# Patient Record
Sex: Female | Born: 1990 | Race: White | Hispanic: No | Marital: Married | Smoking: Current every day smoker
Health system: Southern US, Community
[De-identification: ages and names within clinical notes are randomized; demographics above are authoritative.]

## PROBLEM LIST (undated history)

## (undated) DIAGNOSIS — M1990 Unspecified osteoarthritis, unspecified site: Secondary | ICD-10-CM

## (undated) DIAGNOSIS — M5136 Other intervertebral disc degeneration, lumbar region: Secondary | ICD-10-CM

## (undated) DIAGNOSIS — F1910 Other psychoactive substance abuse, uncomplicated: Secondary | ICD-10-CM

## (undated) DIAGNOSIS — I739 Peripheral vascular disease, unspecified: Secondary | ICD-10-CM

## (undated) DIAGNOSIS — G4730 Sleep apnea, unspecified: Secondary | ICD-10-CM

## (undated) DIAGNOSIS — M51369 Other intervertebral disc degeneration, lumbar region without mention of lumbar back pain or lower extremity pain: Secondary | ICD-10-CM

## (undated) DIAGNOSIS — L0390 Cellulitis, unspecified: Secondary | ICD-10-CM

## (undated) HISTORY — PX: CHOLECYSTECTOMY: SHX55

---

## 2012-04-30 DIAGNOSIS — R079 Chest pain, unspecified: Secondary | ICD-10-CM

## 2014-11-17 ENCOUNTER — Telehealth: Payer: Self-pay | Admitting: Family Medicine

## 2014-11-17 NOTE — Telephone Encounter (Signed)
Patient advised that we do not prescribe suboxone her at our office.

## 2015-12-23 ENCOUNTER — Emergency Department (HOSPITAL_COMMUNITY): Admission: EM | Admit: 2015-12-23 | Discharge: 2015-12-23 | Discharge status: Absent without leave | Payer: Self-pay

## 2015-12-23 ENCOUNTER — Emergency Department (HOSPITAL_COMMUNITY): Payer: Self-pay

## 2015-12-23 ENCOUNTER — Encounter (HOSPITAL_COMMUNITY): Payer: Self-pay

## 2015-12-23 ENCOUNTER — Emergency Department (HOSPITAL_COMMUNITY)
Admission: EM | Admit: 2015-12-23 | Discharge: 2015-12-23 | Disposition: A | Discharge status: Home / Self Care | Payer: Self-pay | Attending: Emergency Medicine | Admitting: Emergency Medicine

## 2015-12-23 DIAGNOSIS — F17200 Nicotine dependence, unspecified, uncomplicated: Secondary | ICD-10-CM | POA: Insufficient documentation

## 2015-12-23 DIAGNOSIS — L03116 Cellulitis of left lower limb: Secondary | ICD-10-CM | POA: Insufficient documentation

## 2015-12-23 MED ORDER — CLINDAMYCIN HCL 150 MG PO CAPS
300.0000 mg | ORAL_CAPSULE | Freq: Once | ORAL | Status: AC
Start: 2015-12-23 — End: 2015-12-23
  Administered 2015-12-23: 300 mg via ORAL
  Filled 2015-12-23: qty 2

## 2015-12-23 MED ORDER — HYDROCODONE-ACETAMINOPHEN 5-325 MG PO TABS: ORAL_TABLET | ORAL | Status: DC

## 2015-12-23 MED ORDER — CLINDAMYCIN HCL 300 MG PO CAPS
300.0000 mg | ORAL_CAPSULE | Freq: Four times a day (QID) | ORAL | Status: DC
Start: 2015-12-23 — End: 2015-12-31

## 2015-12-23 NOTE — ED Provider Notes (Signed)
CSN: [id number]     Arrival date & time 12/23/15  1331 History   First MD Initiated Contact with Patient 12/23/15 1645     Chief Complaint  Patient presents with  . Leg Swelling     (Consider location/radiation/quality/duration/timing/severity/associated sxs/prior Treatment) HPI   Barbara Dodson is a 25 y.o. female who presents to the Emergency Department complaining of pain, swelling and rash to her left lower leg that began three days ago.  She states the following day, a similar appearing rash began on the right lower leg as well.  She states that she has redness and swelling to her left lower leg up to the level of her knee that resolved after elevating her leg.  Pain with weight bearing.  She denies numbness, weakness of the extremities, fever chills, or new exposures.  No hx of previous DVT's.      History reviewed. No pertinent past medical history. Past Surgical History  Procedure Laterality Date  . Cesarean section    . Cholecystectomy     No family history on file. Social History  Substance Use Topics  . Smoking status: Current Every Day Smoker  . Smokeless tobacco: None  . Alcohol Use: No   OB History    No data available     Review of Systems  Constitutional: Negative for fever and chills.  Gastrointestinal: Negative for nausea, vomiting and abdominal pain.  Musculoskeletal: Positive for myalgias. Negative for joint swelling and neck pain.  Skin: Positive for color change and rash. Negative for wound.       Bilateral lower legs  Neurological: Negative for weakness and numbness.  All other systems reviewed and are negative.     Allergies  Amoxicillin; Other; Penicillins; Prednisone; Seroquel; and Sulfa antibiotics  Home Medications   Prior to Admission medications   Not on File   BP 128/81 mmHg  Pulse 90  Temp(Src) 97.9 F (36.6 C) (Temporal)  Resp 16  Ht  (1.702 m)  Wt 125.193 kg  BMI 43.22 kg/m2  SpO2 100%  LMP 12/17/2015 Physical  Exam  Constitutional: She appears well-developed and well-nourished. No distress.  HENT:  Head: Normocephalic.  Mouth/Throat: Oropharynx is clear and moist.  Neck: Normal range of motion.  Cardiovascular: Normal rate, regular rhythm and intact distal pulses.   No murmur heard. Pulmonary/Chest: Effort normal and breath sounds normal. No respiratory distress. She exhibits no tenderness.  Musculoskeletal: Normal range of motion. She exhibits tenderness. She exhibits no edema.  Tenderness of the left posterior lower leg.  No edema  Lymphadenopathy:    She has no cervical adenopathy.  Neurological: She is alert. She exhibits normal muscle tone. Coordination normal.  Skin: Skin is warm. Rash noted.  Non-blanching erythematous plaque to the left lower leg just superior to the ankle.  Smaller, similar appear plaque to the right ankle as well.  No edema. Compartments soft.  Nursing note and vitals reviewed.   ED Course  Procedures (including critical care time) Labs Review Labs Reviewed - No data to display  Imaging Review US Venous Img Lower Unilateral Left  12/23/2015  CLINICAL DATA:  Acute onset of left lower extremity pain, erythema and swelling. Initial encounter. EXAM: LEFT LOWER EXTREMITY VENOUS DOPPLER ULTRASOUND TECHNIQUE: Gray-scale sonography with graded compression, as well as color Doppler and duplex ultrasound were performed to evaluate the lower extremity deep venous systems from the level of the common femoral vein and including the common femoral, femoral, profunda femoral, popliteal and calf veins including  the posterior tibial, peroneal and gastrocnemius veins when visible. The superficial great saphenous vein was also interrogated. Spectral Doppler was utilized to evaluate flow at rest and with distal augmentation maneuvers in the common femoral, femoral and popliteal veins. COMPARISON:  None. FINDINGS: Contralateral Common Femoral Vein: Respiratory phasicity is normal and  symmetric with the symptomatic side. No evidence of thrombus. Normal compressibility. Common Femoral Vein: No evidence of thrombus. Normal compressibility, respiratory phasicity and response to augmentation. Saphenofemoral Junction: No evidence of thrombus. Normal compressibility and flow on color Doppler imaging. Profunda Femoral Vein: No evidence of thrombus. Normal compressibility and flow on color Doppler imaging. Femoral Vein: No evidence of thrombus. Normal compressibility, respiratory phasicity and response to augmentation. Popliteal Vein: No evidence of thrombus. Normal compressibility, respiratory phasicity and response to augmentation. Calf Veins: No evidence of thrombus. Normal compressibility and flow on color Doppler imaging. Superficial Great Saphenous Vein: No evidence of thrombus. Normal compressibility and flow on color Doppler imaging. Venous Reflux:  None. Other Findings:  None. IMPRESSION: No evidence of deep venous thrombosis. Electronically Signed   By: Roanna RaiderJeffery  Chang M.D.   On: 12/23/2015 18:16    I have personally reviewed and evaluated these images and lab results as part of my medical decision-making.   EKG Interpretation None      MDM   Final diagnoses:  Cellulitis of left lower extremity   Rash likely c/w cellulitis.  US neg for DVT.  NV intact.  Pt agrees to clindamycin and close PMD f/u and recheck in 2 days if not improving.  Pt agrees to plan and appears stable for d/c     Pauline Ausammy Evona Westra, PA-C 12/25/15 1419  Bethann BerkshireJoseph Zammit, MD 12/26/15 [phone]

## 2015-12-23 NOTE — Discharge Instructions (Signed)
Cellulitis °Cellulitis is an infection of the skin and the tissue under the skin. The infected area is usually red and tender. This happens most often in the arms and lower legs. °HOME CARE  °· Take your antibiotic medicine as told. Finish the medicine even if you start to feel better. °· Keep the infected arm or leg raised (elevated). °· Put a warm cloth on the area up to 4 times per day. °· Only take medicines as told by your doctor. °· Keep all doctor visits as told. °GET HELP IF: °· You see red streaks on the skin coming from the infected area. °· Your red area gets bigger or turns a dark color. °· Your bone or joint under the infected area is painful after the skin heals. °· Your infection comes back in the same area or different area. °· You have a puffy (swollen) bump in the infected area. °· You have new symptoms. °· You have a fever. °GET HELP RIGHT AWAY IF:  °· You feel very sleepy. °· You throw up (vomit) or have watery poop (diarrhea). °· You feel sick and have muscle aches and pains. °  °This information is not intended to replace advice given to you by your health care provider. Make sure you discuss any questions you have with your health care provider. °  °Document Released: 02/15/2008 Document Revised: 05/20/2015 Document Reviewed: 11/14/2011 °Elsevier Interactive Patient Education ©2016 [employer] ° °

## 2015-12-23 NOTE — ED Notes (Signed)
Pt reports swelling to both lower legs since Friday and red rash started Sunday night.  Denies any itching but is painful.

## 2015-12-23 NOTE — ED Notes (Signed)
Pt with left leg and foot throbbing pain, redness noted to left ankle and right ankle, L>R

## 2015-12-23 NOTE — ED Notes (Signed)
Pedal pulses palpable. Cap refill< 3 sec.

## 2015-12-31 ENCOUNTER — Encounter (HOSPITAL_COMMUNITY): Payer: Self-pay

## 2015-12-31 ENCOUNTER — Emergency Department (HOSPITAL_COMMUNITY)
Admission: EM | Admit: 2015-12-31 | Discharge: 2015-12-31 | Disposition: A | Discharge status: Home / Self Care | Payer: Self-pay | Attending: Emergency Medicine | Admitting: Emergency Medicine

## 2015-12-31 DIAGNOSIS — L03119 Cellulitis of unspecified part of limb: Secondary | ICD-10-CM

## 2015-12-31 DIAGNOSIS — Z791 Long term (current) use of non-steroidal anti-inflammatories (NSAID): Secondary | ICD-10-CM | POA: Insufficient documentation

## 2015-12-31 DIAGNOSIS — L03116 Cellulitis of left lower limb: Secondary | ICD-10-CM | POA: Insufficient documentation

## 2015-12-31 DIAGNOSIS — L03115 Cellulitis of right lower limb: Secondary | ICD-10-CM | POA: Insufficient documentation

## 2015-12-31 DIAGNOSIS — F17200 Nicotine dependence, unspecified, uncomplicated: Secondary | ICD-10-CM | POA: Insufficient documentation

## 2015-12-31 HISTORY — DX: Cellulitis, unspecified: L03.90

## 2015-12-31 MED ORDER — CLINDAMYCIN HCL 150 MG PO CAPS
300.0000 mg | ORAL_CAPSULE | Freq: Once | ORAL | Status: AC
  Administered 2015-12-31: 300 mg via ORAL
  Filled 2015-12-31: qty 2

## 2015-12-31 MED ORDER — HYDROCODONE-ACETAMINOPHEN 5-325 MG PO TABS: 1.0000 | ORAL_TABLET | ORAL | Status: DC | PRN

## 2015-12-31 MED ORDER — DEXAMETHASONE SODIUM PHOSPHATE 10 MG/ML IJ SOLN
10.0000 mg | Freq: Once | INTRAMUSCULAR | Status: AC
  Administered 2015-12-31: 10 mg via INTRAMUSCULAR
  Filled 2015-12-31: qty 1

## 2015-12-31 MED ORDER — CLINDAMYCIN HCL 300 MG PO CAPS: 300.0000 mg | ORAL_CAPSULE | Freq: Three times a day (TID) | ORAL | Status: DC

## 2015-12-31 MED ORDER — HYDROCODONE-ACETAMINOPHEN 5-325 MG PO TABS
1.0000 | ORAL_TABLET | Freq: Once | ORAL | Status: AC
  Administered 2015-12-31: 1 via ORAL
  Filled 2015-12-31: qty 1

## 2015-12-31 NOTE — ED Provider Notes (Signed)
CSN: [id number]     Arrival date & time 12/31/15  2059 History  By signing my name below, I, Budd PalmerVanessa Prueter, attest that this documentation has been prepared under the direction and in the presence of Rolland PorterMark Yoskar Murrillo, MD. Electronically Signed: Budd PalmerVanessa Prueter, ED Scribe. 12/31/2015. 9:53 PM.    Chief Complaint  Patient presents with  . Leg Swelling   The history is provided by the patient. No language interpreter was used.   HPI Comments: Barbara Dodson is a 25 y.o. female smoker with a PMHx of cellulites who presents to the Emergency Department complaining of bilateral, recurrent, painful foot and leg swelling onset today. Pt states she was seen for the same in the ED 1 week ago, when she was diagnosed with cellulitis and prescribed clindamycin. She notes she took her last dose of antibiotics yesterday, and notes that the swelling and redness had improved, but not completely resolved. She states the pain is worse now in her right leg, and both legs feel as though they are throbbing. She reports associated redness to the lower legs and lower back pain. She notes that for the past 6 months she has been standing and walking a lot at work. She notes that she has had leg swelling form this in the past, but never the painful redness. She denies any exposure to new products or environments. She states she does not have a PCP as she does not currently have insurance. Pt denies itching, fever, and chills.  Pt is allergic to penicillins, amoxicillin, sulfa-antibiotics, prednisone, and Seroquel.   Past Medical History  Diagnosis Date  . Cellulitis    Past Surgical History  Procedure Laterality Date  . Cesarean section    . Cholecystectomy     No family history on file. Social History  Substance Use Topics  . Smoking status: Current Every Day Smoker  . Smokeless tobacco: None  . Alcohol Use: No   OB History    No data available     Review of Systems  Constitutional: Negative for fever, chills,  diaphoresis, appetite change and fatigue.  HENT: Negative for mouth sores, sore throat and trouble swallowing.   Eyes: Negative for visual disturbance.  Respiratory: Negative for cough, chest tightness, shortness of breath and wheezing.   Cardiovascular: Positive for leg swelling. Negative for chest pain.  Gastrointestinal: Negative for nausea, vomiting, abdominal pain, diarrhea and abdominal distention.  Endocrine: Negative for polydipsia, polyphagia and polyuria.  Genitourinary: Negative for dysuria, frequency and hematuria.  Musculoskeletal: Positive for myalgias and back pain. Negative for gait problem.  Skin: Positive for color change. Negative for pallor and rash.  Neurological: Negative for dizziness, syncope, light-headedness and headaches.  Hematological: Does not bruise/bleed easily.  Psychiatric/Behavioral: Negative for behavioral problems and confusion.    Allergies  Amoxicillin; Etodolac; Penicillins; Prednisone; Seroquel; and Sulfa antibiotics  Home Medications   Prior to Admission medications   Medication Sig Start Date End Date Taking? Authorizing Provider  citalopram (CELEXA) 20 MG tablet Take 20 mg by mouth 2 (two) times daily.   Yes Historical Provider, MD  ibuprofen (ADVIL,MOTRIN) 200 MG tablet Take 200 mg by mouth every 6 (six) hours as needed for fever or moderate pain.   Yes Historical Provider, MD  clindamycin (CLEOCIN) 300 MG capsule Take 1 capsule (300 mg total) by mouth 3 (three) times daily. 12/31/15   Rolland PorterMark Demarian Epps, MD  HYDROcodone-acetaminophen (NORCO/VICODIN) 5-325 MG tablet Take 1 tablet by mouth every 4 (four) hours as needed. 12/31/15   Loraine LericheMark  Fayrene Fearing, MD   BP 124/83 mmHg  Pulse 96  Temp(Src) 98 F (36.7 C) (Oral)  Resp 20  Ht  (1.702 m)  Wt 270 lb (122.471 kg)  BMI 42.28 kg/m2  SpO2 100%  LMP 12/17/2015 Physical Exam  Constitutional: She is oriented to person, place, and time. She appears well-developed and well-nourished. No distress.  HENT:   Head: Normocephalic.  Eyes: Conjunctivae are normal. Pupils are equal, round, and reactive to light. No scleral icterus.  Neck: Normal range of motion. Neck supple. No thyromegaly present.  Cardiovascular: Normal rate and regular rhythm.  Exam reveals no gallop and no friction rub.   No murmur heard. Pulmonary/Chest: Effort normal and breath sounds normal. No respiratory distress. She has no wheezes. She has no rales.  Abdominal: Soft. Bowel sounds are normal. She exhibits no distension. There is no tenderness. There is no rebound.  Musculoskeletal: Normal range of motion. She exhibits edema.  Diffuse circumferential erythema of the ankle ankles and distal 1/3 or the BLE.   Neurological: She is alert and oriented to person, place, and time.  Skin: Skin is warm and dry. No rash noted. There is erythema.  Psychiatric: She has a normal mood and affect. Her behavior is normal.    ED Course  Procedures  DIAGNOSTIC STUDIES: Oxygen Saturation is 100% on RA, normal by my interpretation.    COORDINATION OF CARE: 9:51 PM - Discussed plans to order a 10-day antibiotic and a decadron shot. Will refer to a PCP. Pt advised of plan for treatment and pt agrees.  Labs Review Labs Reviewed - No data to display  Imaging Review No results found. I have personally reviewed and evaluated these images and lab results as part of my medical decision-making.   EKG Interpretation None      MDM   Final diagnoses:  Cellulitis of lower extremity, unspecified laterality    Does not appear to be a vasculitis. No purpura. Normal perfusion. Likely recurrent cellulitis. We'll lengthen her course of antibiotics. No option allergy for altered antibiotic course. Single dose Decadron here. Elevation. Pain control. Primary care follow-up.  Rolland Porter, MD 12/31/15 [phone]

## 2015-12-31 NOTE — ED Notes (Signed)
My feet and legs are swelling. I did have cellulitis and it went away, looks like it is coming back now.  I was diagnosed 2 weeks ago with cellulitis in both of my legs and I have taken all of my antibiotics.

## 2015-12-31 NOTE — Discharge Instructions (Signed)

## 2016-10-31 ENCOUNTER — Emergency Department (HOSPITAL_COMMUNITY): Admission: EM | Admit: 2016-10-31 | Discharge: 2016-11-01 | Discharge status: Absent without leave | Payer: Self-pay

## 2016-10-31 DIAGNOSIS — L03115 Cellulitis of right lower limb: Secondary | ICD-10-CM | POA: Insufficient documentation

## 2016-10-31 DIAGNOSIS — F17200 Nicotine dependence, unspecified, uncomplicated: Secondary | ICD-10-CM | POA: Insufficient documentation

## 2016-10-31 DIAGNOSIS — M79662 Pain in left lower leg: Secondary | ICD-10-CM | POA: Insufficient documentation

## 2016-11-01 ENCOUNTER — Encounter (HOSPITAL_COMMUNITY): Payer: Self-pay | Admitting: Emergency Medicine

## 2016-11-01 ENCOUNTER — Ambulatory Visit (HOSPITAL_COMMUNITY): Admit: 2016-11-01 | Payer: Self-pay

## 2016-11-01 ENCOUNTER — Emergency Department (HOSPITAL_COMMUNITY)
Admission: EM | Admit: 2016-11-01 | Discharge: 2016-11-01 | Disposition: A | Discharge status: Home / Self Care | Payer: Self-pay | Attending: Emergency Medicine | Admitting: Emergency Medicine

## 2016-11-01 DIAGNOSIS — L03115 Cellulitis of right lower limb: Secondary | ICD-10-CM

## 2016-11-01 DIAGNOSIS — M79604 Pain in right leg: Secondary | ICD-10-CM

## 2016-11-01 HISTORY — DX: Peripheral vascular disease, unspecified: I73.9

## 2016-11-01 HISTORY — DX: Sleep apnea, unspecified: G47.30

## 2016-11-01 LAB — CBC WITH DIFFERENTIAL/PLATELET
Basophils Absolute: 0 10*3/uL (ref 0.0–0.1)
Basophils Relative: 0 %
EOS ABS: 0.2 10*3/uL (ref 0.0–0.7)
EOS PCT: 4 %
HCT: 39.7 % (ref 36.0–46.0)
Hemoglobin: 13.2 g/dL (ref 12.0–15.0)
LYMPHS ABS: 1.9 10*3/uL (ref 0.7–4.0)
LYMPHS PCT: 37 %
MCH: 30.8 pg (ref 26.0–34.0)
MCHC: 33.2 g/dL (ref 30.0–36.0)
MCV: 92.8 fL (ref 78.0–100.0)
MONO ABS: 0.2 10*3/uL (ref 0.1–1.0)
MONOS PCT: 5 %
Neutro Abs: 2.7 10*3/uL (ref 1.7–7.7)
Neutrophils Relative %: 54 %
PLATELETS: 138 10*3/uL — AB (ref 150–400)
RBC: 4.28 MIL/uL (ref 3.87–5.11)
RDW: 13.4 % (ref 11.5–15.5)
WBC: 5 10*3/uL (ref 4.0–10.5)

## 2016-11-01 LAB — BASIC METABOLIC PANEL
Anion gap: 7 (ref 5–15)
BUN: 12 mg/dL (ref 6–20)
CO2: 27 mmol/L (ref 22–32)
Calcium: 8.7 mg/dL — ABNORMAL LOW (ref 8.9–10.3)
Chloride: 106 mmol/L (ref 101–111)
Creatinine, Ser: 0.61 mg/dL (ref 0.44–1.00)
GFR calc Af Amer: >60 mL/min (ref >60)
GLUCOSE: 91 mg/dL (ref 65–99)
Potassium: 4.2 mmol/L (ref 3.5–5.1)
SODIUM: 140 mmol/L (ref 135–145)

## 2016-11-01 LAB — I-STAT BETA HCG BLOOD, ED (MC, WL, AP ONLY)

## 2016-11-01 MED ORDER — HYDROCODONE-ACETAMINOPHEN 5-325 MG PO TABS: 1.0000 | ORAL_TABLET | ORAL | 0 refills | Status: DC | PRN

## 2016-11-01 MED ORDER — DOXYCYCLINE HYCLATE 100 MG PO CAPS: 100.0000 mg | ORAL_CAPSULE | Freq: Two times a day (BID) | ORAL | 0 refills | Status: DC

## 2016-11-01 MED ORDER — ENOXAPARIN SODIUM 150 MG/ML ~~LOC~~ SOLN
1.0000 mg/kg | Freq: Once | SUBCUTANEOUS | Status: AC
  Administered 2016-11-01: 125 mg via SUBCUTANEOUS
  Filled 2016-11-01: qty 1

## 2016-11-01 MED ORDER — VANCOMYCIN HCL IN DEXTROSE 1-5 GM/200ML-% IV SOLN
1000.0000 mg | Freq: Once | INTRAVENOUS | Status: AC
  Administered 2016-11-01: 1000 mg via INTRAVENOUS
  Filled 2016-11-01: qty 200

## 2016-11-01 MED ORDER — FENTANYL CITRATE (PF) 100 MCG/2ML IJ SOLN
50.0000 ug | Freq: Once | INTRAMUSCULAR | Status: AC
  Administered 2016-11-01: 50 ug via INTRAVENOUS
  Filled 2016-11-01: qty 2

## 2016-11-01 NOTE — Discharge Instructions (Addendum)
PLEASE RETURN AT 11:30 AM FOR YOUR ULTRASOUND OF YOUR RIGHT LEG  We will use doxycycline instead of bactrim due your sulfa allergy

## 2016-11-01 NOTE — ED Triage Notes (Signed)
Patient complaining of bilateral leg swelling and redness x 3 days. States she has history of cellulitis in bilateral legs.

## 2016-11-01 NOTE — ED Provider Notes (Signed)
[facility]-EMERGENCY DEPT Provider Note   CSN: [id number] Arrival date & time: 10/31/16  2358  By signing my name below, I, Barbara Dodson, attest that this documentation has been prepared under the direction and in the presence of [name], MD. Electronically Signed: Elder Dodson, Scribe. 11/01/16. 12:47 AM.   History   Chief Complaint Chief Complaint  Patient presents with  . Recurrent Skin Infections    HPI Barbara Dodson is a 26 y.o. female with history of PVD and recurrent cellulitis of the bilateral legs who presents to the ED for evaluation of leg pain. This patient states that 3 days ago she developed worsening pain, redness, and swelling in her lower legs consistent with her chronic disease process. She states that her R leg is more severe than her left. She denies any fevers. Denies chest pain or dyspnea. No nausea, vomiting, or abdominal pain. Denies any history of DVT/PE. Denies any recent trauma to the legs.   The history is provided by the patient. No language interpreter was used.    Past Medical History:  Diagnosis Date  . Cellulitis   . Peripheral vascular disease (HCC)   . Sleep apnea     There are no active problems to display for this patient.   Past Surgical History:  Procedure Laterality Date  . CESAREAN SECTION    . CHOLECYSTECTOMY      OB History    No data available       Home Medications    Prior to Admission medications   Medication Sig Start Date End Date Taking? Authorizing Provider  citalopram (CELEXA) 20 MG tablet Take 20 mg by mouth 2 (two) times daily.    Historical Provider, MD  clindamycin (CLEOCIN) 300 MG capsule Take 1 capsule (300 mg total) by mouth 3 (three) times daily. 12/31/15   Rolland Porter, MD  HYDROcodone-acetaminophen (NORCO/VICODIN) 5-325 MG tablet Take 1 tablet by mouth every 4 (four) hours as needed. 12/31/15   Rolland Porter, MD  ibuprofen (ADVIL,MOTRIN) 200 MG tablet Take 200 mg by mouth every 6 (six) hours as  needed for fever or moderate pain.    Historical Provider, MD    Family History History reviewed. No pertinent family history.  Social History Social History  Substance Use Topics  . Smoking status: Current Every Day Smoker  . Smokeless tobacco: Never Used  . Alcohol use Yes     Comment: occasionally     Allergies   Amoxicillin; Etodolac; Penicillins; Prednisone; Seroquel [quetiapine fumarate]; and Sulfa antibiotics   Review of Systems Review of Systems  Constitutional: Negative for fever.  Respiratory: Negative for shortness of breath.   Cardiovascular: Negative for chest pain.  Gastrointestinal: Negative for abdominal pain, nausea and vomiting.  Skin:       Redness, swelling, and pain to the bilateral lower extremities per HPI  All other systems reviewed and are negative.    Physical Exam Updated Vital Signs BP 130/76 (BP Location: Right Arm)   Pulse 110   Temp 98.2 F (36.8 C) (Oral)   Resp 20   Ht 5\' 8"  (1.727 m)   Wt 270 lb (122.5 kg)   LMP 10/13/2016   SpO2 100%   BMI 41.05 kg/m   Physical Exam CONSTITUTIONAL: Well developed/well nourished HEAD: Normocephalic/atraumatic EYES: EOMI ENMT: Mucous membranes moist NECK: supple no meningeal signs SPINE/BACK:entire spine nontender CV: S1/S2 noted, no murmurs/rubs/gallops noted LUNGS: Lungs are clear to auscultation bilaterally, no apparent distress ABDOMEN: soft, nontender GU:no cva tenderness NEURO: Pt is  awake/alert/appropriate, moves all extremitiesx4.  No facial droop.   EXTREMITIES: pulses normal/equal, full ROM No crepitus noted to the lower extremities full rom of both knees. No abscess noted. See photo. SKIN: warm, color normal PSYCH: no abnormalities of mood noted, alert and oriented to situation  .  Patient gave verbal permission to utilize photo for medical documentation only The image was not stored on any personal device  ED Treatments / Results  DIAGNOSTIC STUDIES: Oxygen Saturation is  100 percent on room air which is normal by my interpretation.    COORDINATION OF CARE: 12:40 AM Discussed treatment plan with pt at bedside and pt agreed to plan.  Labs (all labs ordered are listed, but only abnormal results are displayed) Labs Reviewed  CBC WITH DIFFERENTIAL/PLATELET - Abnormal; Notable for the following:       Result Value   Platelets 138 (*)    All other components within normal limits  BASIC METABOLIC PANEL - Abnormal; Notable for the following:    Calcium 8.7 (*)    All other components within normal limits  I-STAT BETA HCG BLOOD, ED ([facility], [facility], [facility] ONLY)    EKG  EKG Interpretation None       Radiology No results found.  Procedures Procedures    Medications Ordered in ED Medications  vancomycin (VANCOCIN) IVPB 1000 mg/200 mL premix (0 mg Intravenous Stopped 11/01/16 0247)  fentaNYL (SUBLIMAZE) injection 50 mcg (50 mcg Intravenous Given 11/01/16 0135)  enoxaparin (LOVENOX) injection 125 mg (125 mg Subcutaneous Given 11/01/16 0245)     Initial Impression / Assessment and Plan / ED Course  I have reviewed the triage vital signs and the nursing notes.  Pertinent labs   results that were available during my care of the patient were reviewed by me and considered in my medical decision making (see chart for details).     Pt here with recurrent right LE pain/swelling/redness She reports previous h/o injury to leg and since then has recurrent cellulitis She has chronic scar on proximal aspect of right LE but she reports swelling is worse today No abscess or crepitus noted Pt stable appearing, no distress Left LE does not have as much erythema  I feel she can be managed as outpatient for cellulitis, but we discussed return precautions and if erythema noted improved in 48 hrs to return  She will come back at 11:30am later today for outpatient DVT study   Narcotic database reviewed and considered in decision making   Final Clinical Impressions(s) / ED  Diagnoses   Final diagnoses:  Cellulitis of right leg    New Prescriptions Discharge Medication List as of 11/01/2016  3:05 AM    START taking these medications   Details  doxycycline (VIBRAMYCIN) 100 MG capsule Take 1 capsule (100 mg total) by mouth 2 (two) times daily. One po bid x 7 days, Starting Tue 11/01/2016, Print      I personally performed the services described in this documentation, which was scribed in my presence. The recorded information has been reviewed and is accurate.      Zadie Rhineonald English Tomer, MD 11/01/16 [phone]

## 2017-03-07 ENCOUNTER — Emergency Department (HOSPITAL_COMMUNITY): Payer: Self-pay

## 2017-03-07 ENCOUNTER — Encounter (HOSPITAL_COMMUNITY): Payer: Self-pay | Admitting: Emergency Medicine

## 2017-03-07 ENCOUNTER — Emergency Department (HOSPITAL_COMMUNITY)
Admission: EM | Admit: 2017-03-07 | Discharge: 2017-03-07 | Disposition: A | Discharge status: Home / Self Care | Payer: Self-pay | Attending: Emergency Medicine | Admitting: Emergency Medicine

## 2017-03-07 DIAGNOSIS — M5136 Other intervertebral disc degeneration, lumbar region: Secondary | ICD-10-CM | POA: Insufficient documentation

## 2017-03-07 DIAGNOSIS — R0602 Shortness of breath: Secondary | ICD-10-CM | POA: Insufficient documentation

## 2017-03-07 DIAGNOSIS — M25551 Pain in right hip: Secondary | ICD-10-CM

## 2017-03-07 DIAGNOSIS — Z880 Allergy status to penicillin: Secondary | ICD-10-CM | POA: Insufficient documentation

## 2017-03-07 DIAGNOSIS — Z79899 Other long term (current) drug therapy: Secondary | ICD-10-CM | POA: Insufficient documentation

## 2017-03-07 DIAGNOSIS — F17210 Nicotine dependence, cigarettes, uncomplicated: Secondary | ICD-10-CM | POA: Insufficient documentation

## 2017-03-07 DIAGNOSIS — M79604 Pain in right leg: Secondary | ICD-10-CM | POA: Insufficient documentation

## 2017-03-07 LAB — CBC WITH DIFFERENTIAL/PLATELET
BASOS ABS: 0 10*3/uL (ref 0.0–0.1)
BASOS PCT: 0 %
Eosinophils Absolute: 0.3 10*3/uL (ref 0.0–0.7)
Eosinophils Relative: 4 %
HEMATOCRIT: 40.7 % (ref 36.0–46.0)
HEMOGLOBIN: 13.7 g/dL (ref 12.0–15.0)
Lymphocytes Relative: 32 %
Lymphs Abs: 2.2 10*3/uL (ref 0.7–4.0)
MCH: 30.6 pg (ref 26.0–34.0)
MCHC: 33.7 g/dL (ref 30.0–36.0)
MCV: 91.1 fL (ref 78.0–100.0)
MONOS PCT: 5 %
Monocytes Absolute: 0.3 10*3/uL (ref 0.1–1.0)
NEUTROS ABS: 4 10*3/uL (ref 1.7–7.7)
Neutrophils Relative %: 59 %
Platelets: 160 10*3/uL (ref 150–400)
RBC: 4.47 MIL/uL (ref 3.87–5.11)
RDW: 13.4 % (ref 11.5–15.5)
WBC: 6.8 10*3/uL (ref 4.0–10.5)

## 2017-03-07 LAB — BASIC METABOLIC PANEL
ANION GAP: 8 (ref 5–15)
BUN: 9 mg/dL (ref 6–20)
CALCIUM: 8.9 mg/dL (ref 8.9–10.3)
CO2: 26 mmol/L (ref 22–32)
Chloride: 103 mmol/L (ref 101–111)
Creatinine, Ser: 0.66 mg/dL (ref 0.44–1.00)
GFR calc non Af Amer: >60 mL/min (ref >60)
Glucose, Bld: 87 mg/dL (ref 65–99)
Potassium: 3.8 mmol/L (ref 3.5–5.1)
SODIUM: 137 mmol/L (ref 135–145)

## 2017-03-07 LAB — APTT: aPTT: 23 seconds — ABNORMAL LOW (ref 24–36)

## 2017-03-07 LAB — POC URINE PREG, ED: PREG TEST UR: NEGATIVE

## 2017-03-07 LAB — URINALYSIS, ROUTINE W REFLEX MICROSCOPIC
BILIRUBIN URINE: NEGATIVE
Glucose, UA: NEGATIVE mg/dL
HGB URINE DIPSTICK: NEGATIVE
KETONES UR: NEGATIVE mg/dL
Leukocytes, UA: NEGATIVE
NITRITE: NEGATIVE
PROTEIN: NEGATIVE mg/dL
Specific Gravity, Urine: 1.019 (ref 1.005–1.030)
pH: 5 (ref 5.0–8.0)

## 2017-03-07 LAB — PROTIME-INR
INR: 0.94
PROTHROMBIN TIME: 12.6 s (ref 11.4–15.2)

## 2017-03-07 LAB — D-DIMER, QUANTITATIVE (NOT AT ARMC): D DIMER QUANT: 0.53 ug{FEU}/mL — AB (ref 0.00–0.50)

## 2017-03-07 MED ORDER — ACETAMINOPHEN 500 MG PO TABS
1000.0000 mg | ORAL_TABLET | Freq: Once | ORAL | Status: AC
  Administered 2017-03-07: 1000 mg via ORAL
  Filled 2017-03-07: qty 2

## 2017-03-07 MED ORDER — DICLOFENAC SODIUM 75 MG PO TBEC: 75.0000 mg | DELAYED_RELEASE_TABLET | Freq: Two times a day (BID) | ORAL | 0 refills | Status: DC

## 2017-03-07 MED ORDER — DEXAMETHASONE 4 MG PO TABS: 4.0000 mg | ORAL_TABLET | Freq: Two times a day (BID) | ORAL | 0 refills | Status: DC

## 2017-03-07 MED ORDER — KETOROLAC TROMETHAMINE 30 MG/ML IJ SOLN
30.0000 mg | Freq: Once | INTRAMUSCULAR | Status: AC
  Administered 2017-03-07: 30 mg via INTRAVENOUS
  Filled 2017-03-07: qty 1

## 2017-03-07 MED ORDER — DIAZEPAM 5 MG PO TABS
10.0000 mg | ORAL_TABLET | Freq: Once | ORAL | Status: AC
  Administered 2017-03-07: 10 mg via ORAL
  Filled 2017-03-07: qty 2

## 2017-03-07 MED ORDER — IOPAMIDOL (ISOVUE-370) INJECTION 76%
100.0000 mL | Freq: Once | INTRAVENOUS | Status: AC | PRN
  Administered 2017-03-07: 100 mL via INTRAVENOUS

## 2017-03-07 MED ORDER — DEXAMETHASONE SODIUM PHOSPHATE 4 MG/ML IJ SOLN: 8.0000 mg | Freq: Once | INTRAMUSCULAR | Status: DC

## 2017-03-07 MED ORDER — DEXAMETHASONE SODIUM PHOSPHATE 4 MG/ML IJ SOLN
8.0000 mg | Freq: Once | INTRAMUSCULAR | Status: AC
  Administered 2017-03-07: 8 mg via INTRAVENOUS
  Filled 2017-03-07: qty 2

## 2017-03-07 MED ORDER — KETOROLAC TROMETHAMINE 60 MG/2ML IM SOLN: 60.0000 mg | Freq: Once | INTRAMUSCULAR | Status: DC

## 2017-03-07 MED ORDER — PROMETHAZINE HCL 12.5 MG PO TABS
25.0000 mg | ORAL_TABLET | Freq: Once | ORAL | Status: AC
  Administered 2017-03-07: 25 mg via ORAL
  Filled 2017-03-07: qty 2

## 2017-03-07 MED ORDER — CYCLOBENZAPRINE HCL 10 MG PO TABS: 10.0000 mg | ORAL_TABLET | Freq: Three times a day (TID) | ORAL | 0 refills | Status: DC

## 2017-03-07 NOTE — ED Provider Notes (Signed)
[facility] DEPT Provider Note   CSN: [id number] Arrival date & time: 03/07/17  1812     History   Chief Complaint Chief Complaint  Patient presents with  . Hip Pain    HPI Barbara Dodson is a 26 y.o. female.  Patient is a 26 year old female who presents to the emergency department with a complaint of right leg and back pain.  The patient states she has pain from the right knee going up to the hip area. She states that time she has back pain as well. She has not had any recent fall or injury. There's been no recent operations or procedures. Patient has not been in any recent accidents. She states she has been doing her usual standing and walking. She has not changed her exercise. There's been no loss of bowel or bladder function reported. The patient states that she is able to walk and climb steps without major problem. It is also of note that the patient has had problems with cellulitis in the past, but the patient states that this pain feels different than the cellulitis pain she's had in the leg in the past.      Past Medical History:  Diagnosis Date  . Cellulitis   . Peripheral vascular disease (HCC)   . Sleep apnea     There are no active problems to display for this patient.   Past Surgical History:  Procedure Laterality Date  . CESAREAN SECTION    . CHOLECYSTECTOMY      OB History    No data available       Home Medications    Prior to Admission medications   Medication Sig Start Date End Date Taking? Authorizing Provider  citalopram (CELEXA) 20 MG tablet Take 20 mg by mouth 2 (two) times daily.    [provider]  doxycycline (VIBRAMYCIN) 100 MG capsule Take 1 capsule (100 mg total) by mouth 2 (two) times daily. One po bid x 7 days 11/01/16   Zadie RhineWickline, Donald, MD  HYDROcodone-acetaminophen (NORCO/VICODIN) 5-325 MG tablet Take 1 tablet by mouth every 4 (four) hours as needed. 11/01/16   Zadie RhineWickline, Donald, MD    Family History History  reviewed. No pertinent family history.  Social History Social History  Substance Use Topics  . Smoking status: Current Every Day Smoker    Packs/day: 0.50    Types: Cigarettes  . Smokeless tobacco: Never Used  . Alcohol use Yes     Comment: occasionally     Allergies   Amoxicillin; Etodolac; Penicillins; Prednisone; Seroquel [quetiapine fumarate]; and Sulfa antibiotics   Review of Systems Review of Systems  Constitutional: Negative for activity change, appetite change, chills and fever.  HENT: Negative for congestion, ear discharge, ear pain, facial swelling, nosebleeds, rhinorrhea, sneezing and tinnitus.   Eyes: Negative for photophobia, pain and discharge.  Respiratory: Negative for cough, choking, shortness of breath and wheezing.   Cardiovascular: Negative for chest pain, palpitations and leg swelling.  Gastrointestinal: Negative for abdominal pain, blood in stool, constipation, diarrhea, nausea and vomiting.  Genitourinary: Negative for difficulty urinating, dysuria, flank pain, frequency and hematuria.  Musculoskeletal: Positive for arthralgias, back pain and myalgias. Negative for gait problem and neck pain.  Skin: Negative for color change, rash and wound.  Neurological: Negative for dizziness, seizures, syncope, facial asymmetry, speech difficulty, weakness and numbness.  Hematological: Negative for adenopathy. Does not bruise/bleed easily.  Psychiatric/Behavioral: Negative for agitation, confusion, hallucinations, self-injury and suicidal ideas. The patient is not nervous/anxious.  Physical Exam Updated Vital Signs BP 118/86 (BP Location: Right Arm)   Pulse 80   Temp 97.1 F (36.2 C) (Temporal)   Resp 18   Ht 5\' 7"  (1.702 m)   Wt 136.1 kg (300 lb)   LMP 02/28/2017   SpO2 99%   BMI 46.99 kg/m   Physical Exam  Constitutional: She is oriented to person, place, and time. She appears well-developed and well-nourished.  Non-toxic appearance.  HENT:  Head:  Normocephalic.  Right Ear: Tympanic membrane and external ear normal.  Left Ear: Tympanic membrane and external ear normal.  Eyes: EOM and lids are normal. Pupils are equal, round, and reactive to light.  Neck: Normal range of motion. Neck supple. Carotid bruit is not present.  Cardiovascular: Normal rate, regular rhythm, normal heart sounds, intact distal pulses and normal pulses.   Pulmonary/Chest: Breath sounds normal. No respiratory distress.  Abdominal: Soft. Bowel sounds are normal. There is no tenderness. There is no guarding.  Musculoskeletal: Normal range of motion.       Right hip: She exhibits tenderness. She exhibits no swelling and no deformity.       Legs: Patient is ambulatory with mild to moderate difficulty. There is right straight leg raise pain at 30.  There no hot areas appreciated. There is no pitting edema appreciated of the right or left lower extremity. The dorsalis pedis pulses are 2+ bilaterally. There no red areas noted.  Lymphadenopathy:       Head (right side): No submandibular adenopathy present.       Head (left side): No submandibular adenopathy present.    She has no cervical adenopathy.  Neurological: She is alert and oriented to person, place, and time. She has normal strength. No cranial nerve deficit or sensory deficit.  Skin: Skin is warm and dry.  Psychiatric: She has a normal mood and affect. Her speech is normal.  Nursing note and vitals reviewed.    ED Treatments / Results  Labs (all labs ordered are listed, but only abnormal results are displayed) Labs Reviewed  BASIC METABOLIC PANEL  CBC WITH DIFFERENTIAL/PLATELET  D-DIMER, QUANTITATIVE (NOT AT [facility])  PROTIME-INR  APTT    EKG  EKG Interpretation None       Radiology No results found.  Procedures Procedures (including critical care time)  Medications Ordered in ED Medications  diazepam (VALIUM) tablet 10 mg (not administered)  acetaminophen (TYLENOL) tablet 1,000 mg (not  administered)     Initial Impression / Assessment and Plan / ED Course  I have reviewed the triage vital signs and the nursing notes.  Pertinent labs & imaging results that were available during my care of the patient were reviewed by me and considered in my medical decision making (see chart for details).       Final Clinical Impressions(s) / ED Diagnoses MDM Patient presents to the emergency department with complaint of right hip area pain. The patient has had pain in her right lower extremity in the past, but this is usually related to cellulitis. Patient is not noted any hot areas. She denies any injury or trauma to the right leg. The pain is most severe from the area behind the right knee extending up to the right hip. Will evaluate the patient for evidence of infection, DVT, lower extremity pain related to activities of daily living, and back issues causing hip pain.  Patient treated in the emergency department with Tylenol and Valium.   Urine preg is neg. UA is negative. The  basic metabolic panel is well within normal limits. The complete blood count is well within normal limits. The D-dimer test is slightly elevated at 0.53. Urine pregnancy test is negative. The urine analysis is within normal limits. 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 There is noted of degenerative disc disease changes at the L1-L2 area. The remainder of the examination is within normal limits.  The patient received a CT scan after the D-dimer test was slightly elevated. There is no pulmonary embolus or acute aortic syndrome noted.  I discussed these findings with the patient in terms which he understands. Questions were answered. I suspect that the hip pain may be related to the back issue more than anything else. I certainly see no evidence of cellulitis at this time. The patient will be treated with Decadron, diclofenac, and Flexeril. I've asked patient to see Dr. Romeo Apple  for orthopedic evaluation of the back and hip. I reassured the patient that there were no emergent changes or problems noted at this time.    Final diagnoses:  DDD (degenerative disc disease), lumbar  Right hip pain    New Prescriptions New Prescriptions   CYCLOBENZAPRINE (FLEXERIL) 10 MG TABLET    Take 1 tablet (10 mg total) by mouth 3 (three) times daily.   DEXAMETHASONE (DECADRON) 4 MG TABLET    Take 1 tablet (4 mg total) by mouth 2 (two) times daily with a meal.   DICLOFENAC (VOLTAREN) 75 MG EC TABLET    Take 1 tablet (75 mg total) by mouth 2 (two) times daily.     Ivery Quale, PA-C 03/07/17 2252    Bethann Berkshire, MD 03/10/17 [phone]

## 2017-03-07 NOTE — Discharge Instructions (Signed)
Your labs within normal limits. The CT scan of your chest is negative for pulmonary embolism. The x-ray of your lumbar spine suggest degenerative disc disease at the L1-L2 area. This could very well be the source of your pain in the hip area. Please discuss this with Dr. Romeo AppleHarrison. He will provide an orthopedic opinion concerning your back and hip. Please use Decadron and diclofenac 2 times daily with food. Use Flexeril 3 times daily for spasm pain. Flexeril may cause drowsiness, please use this medication with caution.

## 2017-03-07 NOTE — ED Triage Notes (Signed)
Pt reports right leg pain with no injury from the hip down.  Reports pain behind right knee.

## 2017-03-13 ENCOUNTER — Emergency Department (HOSPITAL_COMMUNITY)
Admission: EM | Admit: 2017-03-13 | Discharge: 2017-03-13 | Disposition: A | Discharge status: Home / Self Care | Payer: Self-pay | Attending: Emergency Medicine | Admitting: Emergency Medicine

## 2017-03-13 ENCOUNTER — Emergency Department (HOSPITAL_COMMUNITY): Payer: Self-pay

## 2017-03-13 ENCOUNTER — Encounter (HOSPITAL_COMMUNITY): Payer: Self-pay | Admitting: Emergency Medicine

## 2017-03-13 DIAGNOSIS — M62838 Other muscle spasm: Secondary | ICD-10-CM | POA: Insufficient documentation

## 2017-03-13 DIAGNOSIS — M79605 Pain in left leg: Secondary | ICD-10-CM | POA: Insufficient documentation

## 2017-03-13 DIAGNOSIS — M79604 Pain in right leg: Secondary | ICD-10-CM | POA: Insufficient documentation

## 2017-03-13 DIAGNOSIS — F17210 Nicotine dependence, cigarettes, uncomplicated: Secondary | ICD-10-CM | POA: Insufficient documentation

## 2017-03-13 DIAGNOSIS — Z79899 Other long term (current) drug therapy: Secondary | ICD-10-CM | POA: Insufficient documentation

## 2017-03-13 LAB — CBC WITH DIFFERENTIAL/PLATELET
Basophils Absolute: 0 10*3/uL (ref 0.0–0.1)
Basophils Relative: 0 %
EOS PCT: 3 %
Eosinophils Absolute: 0.2 10*3/uL (ref 0.0–0.7)
HCT: 42.8 % (ref 36.0–46.0)
HEMOGLOBIN: 14.1 g/dL (ref 12.0–15.0)
LYMPHS ABS: 1.2 10*3/uL (ref 0.7–4.0)
LYMPHS PCT: 22 %
MCH: 30.5 pg (ref 26.0–34.0)
MCHC: 32.9 g/dL (ref 30.0–36.0)
MCV: 92.4 fL (ref 78.0–100.0)
Monocytes Absolute: 0.3 10*3/uL (ref 0.1–1.0)
Monocytes Relative: 5 %
NEUTROS PCT: 70 %
Neutro Abs: 3.8 10*3/uL (ref 1.7–7.7)
Platelets: 163 10*3/uL (ref 150–400)
RBC: 4.63 MIL/uL (ref 3.87–5.11)
RDW: 13.8 % (ref 11.5–15.5)
WBC: 5.5 10*3/uL (ref 4.0–10.5)

## 2017-03-13 LAB — BASIC METABOLIC PANEL
Anion gap: 7 (ref 5–15)
BUN: 9 mg/dL (ref 6–20)
CHLORIDE: 102 mmol/L (ref 101–111)
CO2: 28 mmol/L (ref 22–32)
Calcium: 8.7 mg/dL — ABNORMAL LOW (ref 8.9–10.3)
Creatinine, Ser: 0.63 mg/dL (ref 0.44–1.00)
GFR calc Af Amer: >60 mL/min (ref >60)
GFR calc non Af Amer: >60 mL/min (ref >60)
GLUCOSE: 93 mg/dL (ref 65–99)
POTASSIUM: 4 mmol/L (ref 3.5–5.1)
Sodium: 137 mmol/L (ref 135–145)

## 2017-03-13 MED ORDER — METHOCARBAMOL 500 MG PO TABS
1000.0000 mg | ORAL_TABLET | Freq: Once | ORAL | Status: AC
  Administered 2017-03-13: 1000 mg via ORAL
  Filled 2017-03-13: qty 2

## 2017-03-13 MED ORDER — HYDROCODONE-ACETAMINOPHEN 5-325 MG PO TABS: 1.0000 | ORAL_TABLET | ORAL | 0 refills | Status: DC | PRN

## 2017-03-13 MED ORDER — HYDROCODONE-ACETAMINOPHEN 5-325 MG PO TABS
1.0000 | ORAL_TABLET | Freq: Once | ORAL | Status: AC
  Administered 2017-03-13: 1 via ORAL
  Filled 2017-03-13: qty 1

## 2017-03-13 MED ORDER — IBUPROFEN 600 MG PO TABS: 600.0000 mg | ORAL_TABLET | Freq: Four times a day (QID) | ORAL | 0 refills | Status: DC | PRN

## 2017-03-13 MED ORDER — CYCLOBENZAPRINE HCL 10 MG PO TABS: 10.0000 mg | ORAL_TABLET | Freq: Once | ORAL | Status: DC

## 2017-03-13 MED ORDER — DEXAMETHASONE 4 MG PO TABS: 4.0000 mg | ORAL_TABLET | Freq: Two times a day (BID) | ORAL | 0 refills | Status: DC

## 2017-03-13 MED ORDER — METHOCARBAMOL 500 MG PO TABS: 1000.0000 mg | ORAL_TABLET | Freq: Four times a day (QID) | ORAL | 0 refills | Status: AC

## 2017-03-13 NOTE — Discharge Instructions (Signed)
You lab tests and and your ultrasound have ruled out blood clots, a blood vessel problem with your legs and any significant electrolyte abnormalities except that your calcium level is borderline low.  I recommend increasing your calcium intake. If you do not wish to increase dairy intake (milk, cheese), you can take chewable Tums. I recommend chewing one tablet twice daily.  You have been prescribed pain medicine and a new muscle relaxer.  You will need further evaluation if your symptoms persist.  See the referrals above.  You may also benefit from seeing an orthopedic specialist if your symptoms persist.  You may call Dr.Harrison for this followup care.

## 2017-03-13 NOTE — ED Provider Notes (Signed)
[facility] Provider Note   CSN: [id number] Arrival date & time: 03/13/17  1412     History   Chief Complaint Chief Complaint  Patient presents with  . Leg Pain    HPI Barbara Dodson is a 26 y.o. female with a past medical history of episodic cellulitis in her lower extremities and venous insufficiency in her bilateral legs (but is is unclear how this was diagnosed)  with persistent lower extremity pain since her last visit here.  She was seen her 6 days ago which time she had complaint of low back pain and radiation of pain into her right upper thigh which is still present but improved, but now has complaint of pain and edema of her left leg.  She describes a deep aching pain in her calf and posterior thigh and with swelling in the left leg.  Of note, at her last visit she had an elevated d dimer.  A CT chest was negative for acute PE.  She denies CP or sob at this time.  She has had no fevers, chills, change in skin color, rash or injury to her leg.  She has found no alleviators. She is currently completing a course of decadron, flexeril and diclofenac which has not improved her symptoms.  The history is provided by the patient and the spouse.    Past Medical History:  Diagnosis Date  . Cellulitis   . Peripheral vascular disease (HCC)   . Sleep apnea     There are no active problems to display for this patient.   Past Surgical History:  Procedure Laterality Date  . CESAREAN SECTION    . CHOLECYSTECTOMY      OB History    No data available       Home Medications    Prior to Admission medications   Medication Sig Start Date End Date Taking? Authorizing Provider  citalopram (CELEXA) 20 MG tablet Take 20 mg by mouth 2 (two) times daily.    [provider]  diclofenac (VOLTAREN) 75 MG EC tablet Take 1 tablet (75 mg total) by mouth 2 (two) times daily. 03/07/17   Ivery Quale, PA-C  doxycycline (VIBRAMYCIN) 100 MG capsule Take 1 capsule (100 mg total)  by mouth 2 (two) times daily. One po bid x 7 days 11/01/16   [name], MD  HYDROcodone-acetaminophen (NORCO/VICODIN) 5-325 MG tablet Take 1 tablet by mouth every 4 (four) hours as needed. 03/13/17   Burgess Amor, PA-C  ibuprofen (ADVIL,MOTRIN) 600 MG tablet Take 1 tablet (600 mg total) by mouth every 6 (six) hours as needed. 03/13/17   Burgess Amor, PA-C  methocarbamol (ROBAXIN) 500 MG tablet Take 2 tablets (1,000 mg total) by mouth 4 (four) times daily. 03/13/17 03/23/17  Burgess Amor, PA-C    Family History History reviewed. No pertinent family history.  Social History Social History  Substance Use Topics  . Smoking status: Current Every Day Smoker    Packs/day: 0.50    Types: Cigarettes  . Smokeless tobacco: Never Used  . Alcohol use Yes     Comment: occasionally     Allergies   Amoxicillin; Etodolac; Penicillins; Prednisone; Seroquel [quetiapine fumarate]; and Sulfa antibiotics   Review of Systems Review of Systems  Constitutional: Negative for chills and fever.  HENT: Negative.   Respiratory: Negative for shortness of breath.   Cardiovascular: Positive for leg swelling. Negative for chest pain.  Gastrointestinal: Negative.   Musculoskeletal: Positive for arthralgias, back pain and myalgias. Negative for joint  swelling.  Skin: Negative for color change.  Neurological: Negative for weakness and numbness.     Physical Exam Updated Vital Signs BP 107/66   Pulse 83   Temp 98 F (36.7 C) (Oral)   Resp 18   Ht 5\' 8"  (1.727 m)   Wt 136.1 kg (300 lb)   LMP 02/28/2017 Comment: neg u preg on 03/07/17  SpO2 98%   BMI 45.61 kg/m   Physical Exam  Constitutional: She appears well-developed and well-nourished.  Morbidly obese.  HENT:  Head: Normocephalic and atraumatic.  Eyes: Conjunctivae are normal.  Neck: Normal range of motion. Neck supple.  Cardiovascular: Normal rate, regular rhythm, normal heart sounds and intact distal pulses.   Pedal pulses normal.    Pulmonary/Chest: Effort normal and breath sounds normal. She has no wheezes. She exhibits no tenderness.  Abdominal: Soft. Bowel sounds are normal. She exhibits no distension and no mass. There is no tenderness. There is no guarding.  Musculoskeletal: Normal range of motion. She exhibits no edema.       Lumbar back: She exhibits no tenderness, no swelling, no edema and no spasm.       Left upper leg: She exhibits tenderness. She exhibits no edema and no deformity.       Left lower leg: She exhibits tenderness and swelling. She exhibits no deformity and no laceration.  ttp left calf without erythema, induration, red streaking or palpable cords.  Calf circumference measured 3 inches below tibial tuberosity 1.5 inches larger than right.  Negative Homan's. Dorsalis pedal pulses 2+ and equal.  No ankle edema.  Neurological: She is alert. She has normal strength. She displays no atrophy and no tremor. No sensory deficit. Gait normal.  Reflex Scores:      Patellar reflexes are 1+ on the right side and 1+ on the left side.      Achilles reflexes are 1+ on the right side and 1+ on the left side. No strength deficit noted in hip and knee flexor and extensor muscle groups.  Ankle flexion and extension intact. Reduced bilateral lower dtr's but equal.   Skin: Skin is warm and dry.  Psychiatric: She has a normal mood and affect.  Nursing note and vitals reviewed.    ED Treatments / Results  Labs (all labs ordered are listed, but only abnormal results are displayed) Labs Reviewed  BASIC METABOLIC PANEL - Abnormal; Notable for the following:       Result Value   Calcium 8.7 (*)    All other components within normal limits  CBC WITH DIFFERENTIAL/PLATELET    EKG  EKG Interpretation None       Radiology [place] Venous Img Lower Unilateral Left  Result Date: 03/13/2017 CLINICAL DATA:  Pain swelling. EXAM: LEFT LOWER EXTREMITY VENOUS DOPPLER ULTRASOUND TECHNIQUE: Gray-scale sonography with graded  compression, as well as color Doppler and duplex ultrasound were performed to evaluate the lower extremity deep venous systems from the level of the common femoral vein and including the common femoral, femoral, profunda femoral, popliteal and calf veins including the posterior tibial, peroneal and gastrocnemius veins when visible. The superficial great saphenous vein was also interrogated. Spectral Doppler was utilized to evaluate flow at rest and with distal augmentation maneuvers in the common femoral, femoral and popliteal veins. Exam limited due to patient's body habitus. COMPARISON:  None. FINDINGS: Contralateral Common Femoral Vein: Respiratory phasicity is normal and symmetric with the symptomatic side. No evidence of thrombus. Normal compressibility. Common Femoral Vein: No evidence of  thrombus. Normal compressibility, respiratory phasicity and response to augmentation. Saphenofemoral Junction: No evidence of thrombus. Normal compressibility and flow on color Doppler imaging. Profunda Femoral Vein: No evidence of thrombus. Normal compressibility and flow on color Doppler imaging. Femoral Vein: No evidence of thrombus. Normal compressibility, respiratory phasicity and response to augmentation. Popliteal Vein: No evidence of thrombus. Normal compressibility, respiratory phasicity and response to augmentation. Calf Veins: No evidence of thrombus. Normal compressibility and flow on color Doppler imaging. Superficial Great Saphenous Vein: No evidence of thrombus. Normal compressibility and flow on color Doppler imaging. Other Findings:  None. IMPRESSION: No evidence of DVT within the left lower extremity. Electronically Signed   By: Maisie Fus  Register   On: 03/13/2017 16:24    Procedures Procedures (including critical care time)  Medications Ordered in ED Medications  HYDROcodone-acetaminophen (NORCO/VICODIN) 5-325 MG per tablet 1 tablet (1 tablet Oral Given 03/13/17 1619)  methocarbamol (ROBAXIN) tablet  1,000 mg (1,000 mg Oral Given 03/13/17 1756)     Initial Impression / Assessment and Plan / ED Course  I have reviewed the triage vital signs and the nursing notes.  Pertinent labs & imaging results that were available during my care of the patient were reviewed by me and considered in my medical decision making (see chart for details).    Pt with bilateral lower extremity pain by hx and acutely worsened in the left leg today.  Negative dvt.  Upon assessing strength in her legs, she developed muscle spasm of her foot and toes. She reports frequent muscle spasms, esp at night in her sleep.  Labs revealing slightly low calcium level. Advised adding calcium to her diet and/or taking a Tums bid for added calcium.  She was switched to robaxin from flexeril to see if this would help muscle spasm better. Referral given for establishment of pcp. She may also benefit from ortho referral if sx persist, referral given.   Final Clinical Impressions(s) / ED Diagnoses   Final diagnoses:  Bilateral leg pain  Muscle spasm of both lower legs    New Prescriptions Discharge Medication List as of 03/13/2017  5:45 PM    START taking these medications   Details  methocarbamol (ROBAXIN) 500 MG tablet Take 2 tablets (1,000 mg total) by mouth 4 (four) times daily., Starting Mon 03/13/2017, Until Thu 03/23/2017, Print         Burgess Amor, PA-C 03/13/17 2232    Mesner, Barbara Cower, MD 03/16/17 [phone]

## 2017-03-13 NOTE — ED Triage Notes (Signed)
Patient complaining of bilateral leg pain since last night. States she was seen here for right leg pain last week. Denies injury.

## 2017-04-19 ENCOUNTER — Other Ambulatory Visit: Payer: Self-pay

## 2017-04-19 ENCOUNTER — Emergency Department (HOSPITAL_COMMUNITY): Payer: Self-pay

## 2017-04-19 ENCOUNTER — Encounter (HOSPITAL_COMMUNITY): Payer: Self-pay | Admitting: Emergency Medicine

## 2017-04-19 ENCOUNTER — Emergency Department (HOSPITAL_COMMUNITY)
Admission: EM | Admit: 2017-04-19 | Discharge: 2017-04-19 | Disposition: A | Discharge status: Home / Self Care | Payer: Self-pay | Attending: Emergency Medicine | Admitting: Emergency Medicine

## 2017-04-19 DIAGNOSIS — F17210 Nicotine dependence, cigarettes, uncomplicated: Secondary | ICD-10-CM | POA: Insufficient documentation

## 2017-04-19 DIAGNOSIS — Z79899 Other long term (current) drug therapy: Secondary | ICD-10-CM | POA: Insufficient documentation

## 2017-04-19 DIAGNOSIS — R2243 Localized swelling, mass and lump, lower limb, bilateral: Secondary | ICD-10-CM | POA: Insufficient documentation

## 2017-04-19 DIAGNOSIS — R079 Chest pain, unspecified: Secondary | ICD-10-CM | POA: Insufficient documentation

## 2017-04-19 DIAGNOSIS — F329 Major depressive disorder, single episode, unspecified: Secondary | ICD-10-CM | POA: Insufficient documentation

## 2017-04-19 DIAGNOSIS — M79605 Pain in left leg: Secondary | ICD-10-CM | POA: Insufficient documentation

## 2017-04-19 DIAGNOSIS — F32A Depression, unspecified: Secondary | ICD-10-CM

## 2017-04-19 LAB — CBC WITH DIFFERENTIAL/PLATELET
BASOS PCT: 1 %
Basophils Absolute: 0 10*3/uL (ref 0.0–0.1)
EOS ABS: 0.2 10*3/uL (ref 0.0–0.7)
EOS PCT: 2 %
HCT: 42.3 % (ref 36.0–46.0)
HEMOGLOBIN: 14.2 g/dL (ref 12.0–15.0)
Lymphocytes Relative: 35 %
Lymphs Abs: 2.5 10*3/uL (ref 0.7–4.0)
MCH: 30.9 pg (ref 26.0–34.0)
MCHC: 33.6 g/dL (ref 30.0–36.0)
MCV: 92 fL (ref 78.0–100.0)
MONOS PCT: 6 %
Monocytes Absolute: 0.4 10*3/uL (ref 0.1–1.0)
NEUTROS PCT: 56 %
Neutro Abs: 3.9 10*3/uL (ref 1.7–7.7)
PLATELETS: 195 10*3/uL (ref 150–400)
RBC: 4.6 MIL/uL (ref 3.87–5.11)
RDW: 13.8 % (ref 11.5–15.5)
WBC: 7 10*3/uL (ref 4.0–10.5)

## 2017-04-19 LAB — BASIC METABOLIC PANEL
Anion gap: 8 (ref 5–15)
BUN: 14 mg/dL (ref 6–20)
CALCIUM: 9.5 mg/dL (ref 8.9–10.3)
CO2: 27 mmol/L (ref 22–32)
CREATININE: 0.59 mg/dL (ref 0.44–1.00)
Chloride: 103 mmol/L (ref 101–111)
GFR calc Af Amer: >60 mL/min (ref >60)
GFR calc non Af Amer: >60 mL/min (ref >60)
Glucose, Bld: 101 mg/dL — ABNORMAL HIGH (ref 65–99)
Potassium: 4.7 mmol/L (ref 3.5–5.1)
SODIUM: 138 mmol/L (ref 135–145)

## 2017-04-19 LAB — RAPID URINE DRUG SCREEN, HOSP PERFORMED
AMPHETAMINES: NOT DETECTED
BENZODIAZEPINES: NOT DETECTED
Barbiturates: NOT DETECTED
COCAINE: NOT DETECTED
Opiates: NOT DETECTED
Tetrahydrocannabinol: NOT DETECTED

## 2017-04-19 LAB — URINALYSIS, ROUTINE W REFLEX MICROSCOPIC
BILIRUBIN URINE: NEGATIVE
GLUCOSE, UA: NEGATIVE mg/dL
HGB URINE DIPSTICK: NEGATIVE
KETONES UR: NEGATIVE mg/dL
Leukocytes, UA: NEGATIVE
NITRITE: NEGATIVE
PH: 5 (ref 5.0–8.0)
Protein, ur: NEGATIVE mg/dL
Specific Gravity, Urine: 1.023 (ref 1.005–1.030)

## 2017-04-19 LAB — D-DIMER, QUANTITATIVE (NOT AT ARMC)

## 2017-04-19 NOTE — ED Triage Notes (Signed)
Pt was admitted to the ED at 1630 for chest pain and pressure that radiated to her left arm and back. Pt is complaining of SOB. Pt was given 325 ASA before getting here.   Note that the patient was admitted to ED during downtime and this information is being transcribed

## 2017-04-19 NOTE — ED Provider Notes (Signed)
[facility]-EMERGENCY DEPT Provider Note   CSN: [id number] Arrival date & time: 04/19/17  1730     History   Chief Complaint Chief Complaint  Patient presents with  . Chest Pain    HPI Barbara Dodson is a 26 y.o. female.  She presents for evaluation of chest pressure, which started today.  She also has dyspnea upon exertion, without cough, wheezing or dizziness.  He is a smoker.  She does not take oral contraceptives.  She feels pain in both legs, left greater than right, and feels like they are swollen.  She denies abdominal or back pain.  There are no other known modifying factors.  HPI  Past Medical History:  Diagnosis Date  . Cellulitis   . Peripheral vascular disease (HCC)   . Sleep apnea     There are no active problems to display for this patient.   Past Surgical History:  Procedure Laterality Date  . CESAREAN SECTION    . CHOLECYSTECTOMY      OB History    No data available       Home Medications    Prior to Admission medications   Medication Sig Start Date End Date Taking? Authorizing Provider  acetaminophen (TYLENOL) 500 MG tablet Take 2,000 mg by mouth every 6 (six) hours as needed for moderate pain or headache.   Yes [provider]  aspirin-acetaminophen-caffeine (EXCEDRIN MIGRAINE) [id number] MG tablet Take 2 tablets by mouth every 6 (six) hours as needed for headache.   Yes [provider]  diclofenac (VOLTAREN) 75 MG EC tablet Take 1 tablet (75 mg total) by mouth 2 (two) times daily. Patient not taking: Reported on 04/19/2017 03/07/17   Ivery QualeBryant, Hobson, PA-C  HYDROcodone-acetaminophen (NORCO/VICODIN) 5-325 MG tablet Take 1 tablet by mouth every 4 (four) hours as needed. Patient not taking: Reported on 04/19/2017 03/13/17   Burgess AmorIdol, Julie, PA-C  ibuprofen (ADVIL,MOTRIN) 600 MG tablet Take 1 tablet (600 mg total) by mouth every 6 (six) hours as needed. Patient not taking: Reported on 04/19/2017 03/13/17   Burgess AmorIdol, Julie, PA-C    Family History No  family history on file.  Social History Social History  Substance Use Topics  . Smoking status: Current Every Day Smoker    Packs/day: 0.50    Types: Cigarettes  . Smokeless tobacco: Never Used  . Alcohol use Yes     Comment: occasionally     Allergies   Amoxicillin; Seroquel [quetiapine fumarate]; Etodolac; Penicillins; Prednisone; and Sulfa antibiotics   Review of Systems Review of Systems  All other systems reviewed and are negative.    Physical Exam Updated Vital Signs BP 102/70   Pulse 78   Temp 98 F (36.7 C)   Resp 20   Ht 5\' 7"  (1.702 m)   Wt 136.1 kg (300 lb)   SpO2 100%   BMI 46.99 kg/m   Physical Exam  Constitutional: She is oriented to person, place, and time. She appears well-developed. No distress.  Obese  HENT:  Head: Normocephalic and atraumatic.  Eyes: Pupils are equal, round, and reactive to light. Conjunctivae and EOM are normal.  Neck: Normal range of motion and phonation normal. Neck supple.  Cardiovascular: Normal rate and regular rhythm.   Pulmonary/Chest: Effort normal and breath sounds normal. No respiratory distress. She exhibits no tenderness.  Abdominal: Soft. She exhibits no distension. There is no tenderness. There is no guarding.  Musculoskeletal: Normal range of motion.  Mild tenderness left upper leg and left lower leg; 2+ edema  both legs bilaterally, lower.  Neurological: She is alert and oriented to person, place, and time. She exhibits normal muscle tone.  Skin: Skin is warm and dry.  Psychiatric: She has a normal mood and affect. Her behavior is normal. Judgment and thought content normal.  Nursing note and vitals reviewed.    ED Treatments / Results  Labs (all labs ordered are listed, but only abnormal results are displayed) Labs Reviewed  BASIC METABOLIC PANEL - Abnormal; Notable for the following:       Result Value   Glucose, Bld 101 (*)    All other components within normal limits  CBC WITH DIFFERENTIAL/PLATELET   D-DIMER, QUANTITATIVE (NOT AT [facility])  URINALYSIS, ROUTINE W REFLEX MICROSCOPIC  RAPID URINE DRUG SCREEN, HOSP PERFORMED    EKG  EKG Interpretation None        Date: 04/19/17  Rate: 71  Rhythm: normal sinus rhythm  QRS Axis: normal  PR and QT Intervals: normal  ST/T Wave abnormalities: normal  PR and QRS Conduction Disutrbances:none  Narrative Interpretation:      Radiology Dg Chest 2 View  Result Date: 04/19/2017 CLINICAL DATA:  Chest pain EXAM: CHEST  2 VIEW COMPARISON:  03/07/2017 FINDINGS: Normal heart size. Lungs clear. No pneumothorax. No pleural effusion. IMPRESSION: No active cardiopulmonary disease. Electronically Signed   By: Jolaine Click M.D.   On: 04/19/2017 18:59    Procedures Procedures (including critical care time)  Medications Ordered in ED Medications - No data to display   Initial Impression / Assessment and Plan / ED Course  I have reviewed the triage vital signs and the nursing notes.  Pertinent labs & imaging results that were available during my care of the patient were reviewed by me and considered in my medical decision making (see chart for details).      Patient Vitals for the past 24 hrs:  BP Temp Pulse Resp SpO2 Height Weight  04/19/17 2100 102/70 - 78 20 100 % - -  04/19/17 2030 116/70 - 80 19 99 % - -  04/19/17 2015 - - 89 (!) 22 100 % - -  04/19/17 2000 118/67 - 86 (!) 24 100 % - -  04/19/17 1945 - - 76 (!) 25 100 % - -  04/19/17 1930 107/61 - 78 19 100 % - -  04/19/17 1916 (!) 104/49 - 70 18 100 % - -  04/19/17 1630 - - - - - 5\' 7"  (1.702 m) 136.1 kg (300 lb)  04/19/17 1625 126/71 98 F (36.7 C) 60 16 100 % - -    9:34 PM Reevaluation with update and discussion. After initial assessment and treatment, an updated evaluation reveals patient remains comfortable, findings discussed with her, she wonders if her depression and anxiety could be causing the chest discomfort.  She is not currently taking medications or seeing a counselor  for it.  All questions answered. Joann Jorge L    Final Clinical Impressions(s) / ED Diagnoses   Final diagnoses:  Nonspecific chest pain  Depression, unspecified depression type   Nonspecific chest pain.  Reassuring evaluation.  Anxiety history, without evidence for unstable psychiatric condition.  Doubt ACS, PE or pneumonia.  Nursing Notes Reviewed/ Care Coordinated Applicable Imaging Reviewed Interpretation of Laboratory Data incorporated into ED treatment  The patient appears reasonably screened and/or stabilized for discharge and I doubt any other medical condition or other [facility] requiring further screening, evaluation, or treatment in the ED at this time prior to discharge.  Plan: Home  Medications-continue current medications; Home Treatments-rest, fluids; return here if the recommended treatment, does not improve the symptoms; Recommended follow up-PCP and counselor, as needed.   New Prescriptions Discharge Medication List as of 04/19/2017  9:33 PM       Mancel Bale, MD 04/20/17 1306

## 2017-04-19 NOTE — Discharge Instructions (Signed)
The testing today, was reassuring.  Consider following up with a counselor regarding your depression and anxiety.  See the attached resource guide to help you find a counselor.  Return here, if needed, for problems.

## 2017-04-19 NOTE — ED Notes (Signed)
Pt ambulatory to waiting room. Pt verbalized understanding of discharge instructions.   

## 2017-08-25 ENCOUNTER — Encounter (HOSPITAL_COMMUNITY): Payer: Self-pay | Admitting: Emergency Medicine

## 2017-08-25 ENCOUNTER — Emergency Department (HOSPITAL_COMMUNITY)
Admission: EM | Admit: 2017-08-25 | Discharge: 2017-08-25 | Disposition: A | Discharge status: Home / Self Care | Payer: Self-pay | Attending: Emergency Medicine | Admitting: Emergency Medicine

## 2017-08-25 ENCOUNTER — Other Ambulatory Visit: Payer: Self-pay

## 2017-08-25 DIAGNOSIS — Z7982 Long term (current) use of aspirin: Secondary | ICD-10-CM | POA: Insufficient documentation

## 2017-08-25 DIAGNOSIS — Z79899 Other long term (current) drug therapy: Secondary | ICD-10-CM | POA: Insufficient documentation

## 2017-08-25 DIAGNOSIS — L0291 Cutaneous abscess, unspecified: Secondary | ICD-10-CM

## 2017-08-25 DIAGNOSIS — F17210 Nicotine dependence, cigarettes, uncomplicated: Secondary | ICD-10-CM | POA: Insufficient documentation

## 2017-08-25 DIAGNOSIS — L02416 Cutaneous abscess of left lower limb: Secondary | ICD-10-CM | POA: Insufficient documentation

## 2017-08-25 HISTORY — DX: Unspecified osteoarthritis, unspecified site: M19.90

## 2017-08-25 MED ORDER — LIDOCAINE-EPINEPHRINE 1 %-1:200000 IJ SOLN
10.0000 mL | Freq: Once | INTRAMUSCULAR | Status: AC
Start: 2017-08-25 — End: 2017-08-25
  Administered 2017-08-25: 10 mL
  Filled 2017-08-25: qty 30

## 2017-08-25 MED ORDER — DOXYCYCLINE HYCLATE 100 MG PO TABS: 100.0000 mg | ORAL_TABLET | Freq: Once | ORAL | Status: DC

## 2017-08-25 MED ORDER — CLINDAMYCIN HCL 150 MG PO CAPS: 300.0000 mg | ORAL_CAPSULE | Freq: Three times a day (TID) | ORAL | 0 refills | Status: AC

## 2017-08-25 MED ORDER — CLINDAMYCIN HCL 150 MG PO CAPS
300.0000 mg | ORAL_CAPSULE | Freq: Once | ORAL | Status: AC
  Administered 2017-08-25: 300 mg via ORAL
  Filled 2017-08-25: qty 2

## 2017-08-25 MED ORDER — POVIDONE-IODINE 10 % EX SOLN
CUTANEOUS | Status: AC
  Filled 2017-08-25: qty 15

## 2017-08-25 MED ORDER — HYDROCODONE-ACETAMINOPHEN 5-325 MG PO TABS: 1.0000 | ORAL_TABLET | ORAL | 0 refills | Status: AC | PRN

## 2017-08-25 NOTE — ED Triage Notes (Signed)
PT c/o abscess with no drainage to left inner thigh x2 days with no fever.

## 2017-08-25 NOTE — Discharge Instructions (Signed)
Take the entire course of the antibiotics.  Apply a warm water soak (or shower massage) to the site twice daily as discussed.  You may take the hydrocodone prescribed for pain relief.  This will make you drowsy - do not drive within 4 hours of taking this medication.

## 2017-08-25 NOTE — ED Provider Notes (Signed)
[facility] EMERGENCY DEPARTMENT Provider Note   CSN: [id number] Arrival date & time: 08/25/17  1851     History   Chief Complaint Chief Complaint  Patient presents with  . Abscess    HPI Barbara Dodson is a 26 y.o. female.  The history is provided by the patient.  Abscess  Location:  Leg Leg abscess location:  L upper leg Abscess quality: fluctuance, induration, painful and redness   Abscess quality: not draining   Red streaking: no   Duration:  2 days Pain details:    Quality:  Sharp   Severity:  Severe   Duration:  1 day   Timing:  Constant   Progression:  Worsening Chronicity:  New (Patient has a history of cellulitis and occasional abscesses.) Context: not diabetes and not skin injury   Relieved by:  Nothing Exacerbated by: Palpation. Ineffective treatments:  None tried Associated symptoms: no anorexia, no fatigue, no fever, no nausea and no vomiting   Risk factors: no family hx of MRSA and no hx of MRSA     Past Medical History:  Diagnosis Date  . Cellulitis   . DJD (degenerative joint disease)   . Peripheral vascular disease (HCC)   . Sleep apnea     There are no active problems to display for this patient.   Past Surgical History:  Procedure Laterality Date  . CESAREAN SECTION    . CHOLECYSTECTOMY      OB History    No data available       Home Medications    Prior to Admission medications   Medication Sig Start Date End Date Taking? Authorizing Provider  acetaminophen (TYLENOL) 500 MG tablet Take 2,000 mg by mouth every 6 (six) hours as needed for moderate pain or headache.    [provider]  aspirin-acetaminophen-caffeine (EXCEDRIN MIGRAINE) [id number] MG tablet Take 2 tablets by mouth every 6 (six) hours as needed for headache.    [provider]  clindamycin (CLEOCIN) 150 MG capsule Take 2 capsules (300 mg total) by mouth 3 (three) times daily. 08/25/17   Burgess AmorIdol, Anali Cabanilla, PA-C  diclofenac (VOLTAREN) 75 MG EC  tablet Take 1 tablet (75 mg total) by mouth 2 (two) times daily. Patient not taking: Reported on 04/19/2017 03/07/17   Ivery QualeBryant, Hobson, PA-C  HYDROcodone-acetaminophen (NORCO/VICODIN) 5-325 MG tablet Take 1 tablet by mouth every 4 (four) hours as needed. 08/25/17   Burgess AmorIdol, Kayton Ripp, PA-C  ibuprofen (ADVIL,MOTRIN) 600 MG tablet Take 1 tablet (600 mg total) by mouth every 6 (six) hours as needed. Patient not taking: Reported on 04/19/2017 03/13/17   Burgess AmorIdol, Eyan Hagood, PA-C    Family History History reviewed. No pertinent family history.  Social History Social History   Tobacco Use  . Smoking status: Current Every Day Smoker    Packs/day: 0.50    Types: Cigarettes  . Smokeless tobacco: Never Used  Substance Use Topics  . Alcohol use: Yes    Comment: occasionally  . Drug use: No     Allergies   Amoxicillin; Seroquel [quetiapine fumarate]; Etodolac; Penicillins; Prednisone; and Sulfa antibiotics   Review of Systems Review of Systems  Constitutional: Negative for chills, fatigue and fever.  Respiratory: Negative for shortness of breath and wheezing.   Gastrointestinal: Negative for anorexia, nausea and vomiting.  Skin: Positive for color change.  Neurological: Negative for numbness.     Physical Exam Updated Vital Signs BP 133/87 (BP Location: Right Arm)   Pulse 99   Temp 98.1 F (36.7 C) (  Oral)   Resp 18   Ht 5\' 7"  (1.702 m)   Wt (!) 147.9 kg (326 lb)   LMP 08/19/2017   SpO2 100%   BMI 51.06 kg/m   Physical Exam  Constitutional: She appears well-developed and well-nourished. No distress.  HENT:  Head: Normocephalic.  Neck: Neck supple.  Cardiovascular: Normal rate.  Pulmonary/Chest: Effort normal. She has no wheezes.  Musculoskeletal: Normal range of motion. She exhibits no edema.  Skin: There is erythema.  There is a 5 cm area of indurated erythema left upper medial thigh.  There is a 1 cm central raised fluctuance which is non-draining.  There is no red streaking.     ED  Treatments / Results  Labs (all labs ordered are listed, but only abnormal results are displayed) Labs Reviewed - No data to display  EKG  EKG Interpretation None       Radiology No results found.  Procedures Procedures (including critical care time)  INCISION AND DRAINAGE Performed by: Burgess AmorIDOL, Irianna Gilday Consent: Verbal consent obtained. Risks and benefits: risks, benefits and alternatives were discussed Type: abscess  Body area: Left medial thigh  Anesthesia: local infiltration  Incision was made with a scalpel.  Local anesthetic: lidocaine 1 % without epinephrine  Anesthetic total: 4 ml  Complexity: complex Blunt dissection to break up loculations  Drainage: purulent  Drainage amount: Copious  Packing material: none  Patient tolerance: Patient tolerated the procedure well with no immediate complications.     Medications Ordered in ED Medications  povidone-iodine (BETADINE) 10 % external solution (not administered)  lidocaine-EPINEPHrine (XYLOCAINE-EPINEPHrine) 1 %-1:200000 (PF) injection 10 mL (10 mLs Other Given 08/25/17 2046)  clindamycin (CLEOCIN) capsule 300 mg (300 mg Oral Given 08/25/17 2135)     Initial Impression / Assessment and Plan / ED Course  I have reviewed the triage vital signs and the nursing notes.  Pertinent labs & imaging results that were available during my care of the patient were reviewed by me and considered in my medical decision making (see chart for details).     Patient was placed on clindamycin, hydrocodone prescribed for pain.  She was advised to continue twice daily warm soaks and advised follow-up recheck appointment with her PCP or returning here for any worsened or persistent symptoms.  Final Clinical Impressions(s) / ED Diagnoses   Final diagnoses:  Abscess    ED Discharge Orders        Ordered    HYDROcodone-acetaminophen (NORCO/VICODIN) 5-325 MG tablet  Every 4 hours PRN     08/25/17 2124    clindamycin  (CLEOCIN) 150 MG capsule  3 times daily     08/25/17 2124       Burgess Amordol, Alexsander Cavins, PA-C 08/25/17 2313    Samuel JesterMcManus, Kathleen, DO 08/28/17 1910

## 2017-09-08 ENCOUNTER — Emergency Department (HOSPITAL_COMMUNITY): Payer: Self-pay

## 2017-09-08 ENCOUNTER — Other Ambulatory Visit: Payer: Self-pay

## 2017-09-08 ENCOUNTER — Encounter (HOSPITAL_COMMUNITY): Payer: Self-pay

## 2017-09-08 ENCOUNTER — Emergency Department (HOSPITAL_COMMUNITY)
Admission: EM | Admit: 2017-09-08 | Discharge: 2017-09-08 | Disposition: A | Discharge status: Home / Self Care | Payer: Self-pay | Attending: Emergency Medicine | Admitting: Emergency Medicine

## 2017-09-08 DIAGNOSIS — M25561 Pain in right knee: Secondary | ICD-10-CM | POA: Insufficient documentation

## 2017-09-08 DIAGNOSIS — F17210 Nicotine dependence, cigarettes, uncomplicated: Secondary | ICD-10-CM | POA: Insufficient documentation

## 2017-09-08 DIAGNOSIS — Z79899 Other long term (current) drug therapy: Secondary | ICD-10-CM | POA: Insufficient documentation

## 2017-09-08 DIAGNOSIS — Z7982 Long term (current) use of aspirin: Secondary | ICD-10-CM | POA: Insufficient documentation

## 2017-09-08 HISTORY — DX: Other intervertebral disc degeneration, lumbar region without mention of lumbar back pain or lower extremity pain: M51.369

## 2017-09-08 HISTORY — DX: Other psychoactive substance abuse, uncomplicated: F19.10

## 2017-09-08 HISTORY — DX: Other intervertebral disc degeneration, lumbar region: M51.36

## 2017-09-08 LAB — CBC WITH DIFFERENTIAL/PLATELET
Basophils Absolute: 0 10*3/uL (ref 0.0–0.1)
Basophils Relative: 0 %
EOS PCT: 2 %
Eosinophils Absolute: 0.1 10*3/uL (ref 0.0–0.7)
HCT: 43.6 % (ref 36.0–46.0)
Hemoglobin: 14.1 g/dL (ref 12.0–15.0)
LYMPHS ABS: 2.4 10*3/uL (ref 0.7–4.0)
LYMPHS PCT: 28 %
MCH: 29.2 pg (ref 26.0–34.0)
MCHC: 32.3 g/dL (ref 30.0–36.0)
MCV: 90.3 fL (ref 78.0–100.0)
MONO ABS: 0.5 10*3/uL (ref 0.1–1.0)
MONOS PCT: 5 %
Neutro Abs: 5.5 10*3/uL (ref 1.7–7.7)
Neutrophils Relative %: 65 %
PLATELETS: 201 10*3/uL (ref 150–400)
RBC: 4.83 MIL/uL (ref 3.87–5.11)
RDW: 13.1 % (ref 11.5–15.5)
WBC: 8.5 10*3/uL (ref 4.0–10.5)

## 2017-09-08 LAB — BASIC METABOLIC PANEL
Anion gap: 13 (ref 5–15)
BUN: 9 mg/dL (ref 6–20)
CO2: 22 mmol/L (ref 22–32)
CREATININE: 0.63 mg/dL (ref 0.44–1.00)
Calcium: 9.3 mg/dL (ref 8.9–10.3)
Chloride: 102 mmol/L (ref 101–111)
GFR calc Af Amer: >60 mL/min (ref >60)
GLUCOSE: 98 mg/dL (ref 65–99)
POTASSIUM: 4.4 mmol/L (ref 3.5–5.1)
Sodium: 137 mmol/L (ref 135–145)

## 2017-09-08 LAB — D-DIMER, QUANTITATIVE: D-Dimer, Quant: <0.27 ug/mL-FEU (ref 0.00–0.50)

## 2017-09-08 MED ORDER — TRAMADOL HCL 50 MG PO TABS: 50.0000 mg | ORAL_TABLET | Freq: Four times a day (QID) | ORAL | 0 refills | Status: AC | PRN

## 2017-09-08 NOTE — ED Notes (Signed)
Pt reports awakened with pain from her groin to her knee Reports that she took tylenol without relief Pt is morbidly obese  Leg is reddened and non streaked

## 2017-09-08 NOTE — ED Notes (Signed)
From Rad 

## 2017-09-08 NOTE — ED Notes (Signed)
Pt SO is also a pt and reports that he can get his mother here to drive them home

## 2017-09-08 NOTE — ED Notes (Signed)
To Rad 

## 2017-09-08 NOTE — ED Notes (Signed)
TT in to discuss with pt
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
## 2017-09-08 NOTE — ED Notes (Signed)
Out of room to BR Pt ambulates without limp or drift  She visits pt in another room before returning to her room  TT in to assess

## 2017-09-08 NOTE — ED Provider Notes (Signed)
[facility] EMERGENCY DEPARTMENT Provider Note   CSN: [id number] Arrival date & time: 09/08/17  1216     History   Chief Complaint Chief Complaint  Patient presents with  . Knee Pain    HPI Barbara Dodson is a 26 y.o. female.  HPI   Barbara Dodson is a 26 y.o. female who presents to the Emergency Department complaining of pain redness and swelling to the right knee.  She states that she woke up with the symptoms this morning.  She reports history of a mechanical fall several days ago, but states that she did not injure her knee at the time.  She describes a throbbing pain to the anterior right knee that radiates into her groin and also into the calf.  Pain is worse with weightbearing and bending.  She reports a history of frequent cellulitis of her left lower extremity and she is concerned her symptoms may be related.  She is recently completed a course of antibiotics for an abscess.  She denies numbness or weakness of the extremity, fever, chills, previous DVT, chest pain, or shortness of breath.   Past Medical History:  Diagnosis Date  . Cellulitis   . DDD (degenerative disc disease), lumbar   . DJD (degenerative joint disease)   . Peripheral vascular disease (HCC)   . Sleep apnea   . Substance abuse (HCC)     There are no active problems to display for this patient.   Past Surgical History:  Procedure Laterality Date  . CESAREAN SECTION    . CHOLECYSTECTOMY      OB History    No data available       Home Medications    Prior to Admission medications   Medication Sig Start Date End Date Taking? Authorizing Provider  acetaminophen (TYLENOL) 500 MG tablet Take 2,000 mg by mouth every 6 (six) hours as needed for moderate pain or headache.    [provider]  aspirin-acetaminophen-caffeine (EXCEDRIN MIGRAINE) [phone] MG tablet Take 2 tablets by mouth every 6 (six) hours as needed for headache.    [provider]  clindamycin  (CLEOCIN) 150 MG capsule Take 2 capsules (300 mg total) by mouth 3 (three) times daily. 08/25/17   Burgess Amor, PA-C  diclofenac (VOLTAREN) 75 MG EC tablet Take 1 tablet (75 mg total) by mouth 2 (two) times daily. Patient not taking: Reported on 04/19/2017 03/07/17   Ivery Quale, PA-C  HYDROcodone-acetaminophen (NORCO/VICODIN) 5-325 MG tablet Take 1 tablet by mouth every 4 (four) hours as needed. 08/25/17   Burgess Amor, PA-C  ibuprofen (ADVIL,MOTRIN) 600 MG tablet Take 1 tablet (600 mg total) by mouth every 6 (six) hours as needed. Patient not taking: Reported on 04/19/2017 03/13/17   Burgess Amor, PA-C    Family History No family history on file.  Social History Social History   Tobacco Use  . Smoking status: Current Every Day Smoker    Packs/day: 0.50    Types: Cigarettes  . Smokeless tobacco: Never Used  Substance Use Topics  . Alcohol use: Yes    Comment: occasionally  . Drug use: No     Allergies   Amoxicillin; Seroquel [quetiapine fumarate]; Etodolac; Penicillins; Prednisone; and Sulfa antibiotics   Review of Systems Review of Systems  Constitutional: Negative for chills and fever.  Respiratory: Negative for shortness of breath.   Cardiovascular: Negative for chest pain and palpitations.  Genitourinary: Negative for difficulty urinating and dysuria.  Musculoskeletal: Positive for arthralgias (Right knee pain and  swelling) and joint swelling.  Skin: Negative for color change (Redness of the right leg) and wound.  Neurological: Negative for syncope, weakness and numbness.  All other systems reviewed and are negative.    Physical Exam Updated Vital Signs BP (!) 144/65 (BP Location: Right Arm)   Pulse (!) 112   Temp 98.1 F (36.7 C) (Oral)   Resp 19   Ht 5\' 7"  (1.702 m)   Wt (!) 147.9 kg (326 lb)   LMP 08/19/2017   SpO2 99%   BMI 51.06 kg/m   Physical Exam  Constitutional: She is oriented to person, place, and time. She appears well-developed and well-nourished.  No distress.  HENT:  Head: Atraumatic.  Neck: Normal range of motion. Neck supple.  Cardiovascular: Normal rate, regular rhythm, normal heart sounds and intact distal pulses.  Dorsalis pedis and posterior tibial pulses are palpable and brisk  Pulmonary/Chest: Effort normal and breath sounds normal. No respiratory distress.  Musculoskeletal: Normal range of motion. She exhibits tenderness. She exhibits no deformity.  Diffuse tenderness to palpation of the anterior right knee and right calf.  No tenderness of the posterior calf.  Negative Homans sign.  No obvious effusion of the knee, difficult to determine edema due to body habitus.  Neurological: She is alert and oriented to person, place, and time. No sensory deficit.  Skin: Skin is warm. Capillary refill takes less than 2 seconds. There is erythema.  Splotchy erythema of the right anterior thigh, medial knee and anterior lower leg.  No excessive warmth.  Skin easily blanches  Nursing note and vitals reviewed.    ED Treatments / Results  Labs (all labs ordered are listed, but only abnormal results are displayed) Labs Reviewed  D-DIMER, QUANTITATIVE (NOT AT [facility])  CBC WITH DIFFERENTIAL/PLATELET  BASIC METABOLIC PANEL    EKG  EKG Interpretation None       Radiology [place] Venous Img Lower Unilateral Right  Result Date: 09/08/2017 CLINICAL DATA:  Right lower extremity pain and redness with edema. Recent fall 1 week ago. EXAM: RIGHT LOWER EXTREMITY VENOUS DOPPLER ULTRASOUND TECHNIQUE: Gray-scale sonography with graded compression, as well as color Doppler and duplex ultrasound were performed to evaluate the lower extremity deep venous systems from the level of the common femoral vein and including the common femoral, femoral, profunda femoral, popliteal and calf veins including the posterior tibial, peroneal and gastrocnemius veins when visible. The superficial great saphenous vein was also interrogated. Spectral Doppler was utilized to  evaluate flow at rest and with distal augmentation maneuvers in the common femoral, femoral and popliteal veins. COMPARISON:  None. FINDINGS: Contralateral Common Femoral Vein: Respiratory phasicity is normal and symmetric with the symptomatic side. No evidence of thrombus. Normal compressibility. Common Femoral Vein: No evidence of thrombus. Normal compressibility, respiratory phasicity and response to augmentation. Saphenofemoral Junction: No evidence of thrombus. Normal compressibility and flow on color Doppler imaging. Profunda Femoral Vein: No evidence of thrombus. Normal compressibility and flow on color Doppler imaging. Femoral Vein: No evidence of thrombus. Normal compressibility, respiratory phasicity and response to augmentation. Popliteal Vein: No evidence of thrombus. Normal compressibility, respiratory phasicity and response to augmentation. Calf Veins: No evidence of thrombus. Normal compressibility and flow on color Doppler imaging. Superficial Great Saphenous Vein: No evidence of thrombus. Normal compressibility. Venous Reflux:  None. Other Findings:  None. IMPRESSION: No evidence of deep venous thrombosis. Electronically Signed   By: Judie PetitM.  Shick M.D.   On: 09/08/2017 13:58   Dg Knee Complete 4 Views Right  Result Date: 09/08/2017 CLINICAL DATA:  Pain and swelling EXAM: RIGHT KNEE - COMPLETE 4+ VIEW COMPARISON:  None. FINDINGS: Frontal, lateral, and bilateral oblique views were obtained. There is no fracture or dislocation. No joint effusion. Joint spaces appear normal. No erosive change. IMPRESSION: No evident fracture or joint effusion.  No evident arthropathy. Electronically Signed   By: Bretta BangWilliam  Woodruff III M.D.   On: 09/08/2017 13:26    Procedures Procedures (including critical care time)  Medications Ordered in ED Medications - No data to display   Initial Impression / Assessment and Plan / ED Course  I have reviewed the triage vital signs and the nursing notes.  Pertinent labs  & imaging results that were available during my care of the patient were reviewed by me and considered in my medical decision making (see chart for details).      Patient reviewed on the narcotics database, received #10 hydrocodone 5/325 mg on 08/26/2017  Labs, x-ray and ultrasound of lower extremity are reassuring.  Erythema of the extremity is splotchy and given negative d-dimer and ultrasound, my suspicion for DVT is low.  also doubtful of cellulitis.  Possible strain of knee secondary to recent fall.   Knee sleeve applied.  Pt agrees to RICE therapy and close orthopedic f/u.   Stable for d/c  Final Clinical Impressions(s) / ED Diagnoses   Final diagnoses:  Acute pain of right knee    ED Discharge Orders    None       Pauline Ausriplett, Jazmin Ley, PA-C 09/09/17 1222    Loren RacerYelverton, David, MD 09/09/17 1630

## 2017-09-08 NOTE — ED Notes (Signed)
Lab in to draw

## 2017-09-08 NOTE — ED Notes (Signed)
Pt reports that she might be allergic to ultram when given her discharge instructions.  She then states perhaps it is something else that she is allergic to and she wants the prescription

## 2017-09-08 NOTE — Discharge Instructions (Signed)
Wear the brace as needed for support when walking or standing.  Do not wear continuously.  Elevate and apply ice packs on and off to your knee.  Call one of the orthopedic providers listed above to arrange a follow-up appointment in 1 week if not improving.

## 2018-09-20 IMAGING — DX DG LUMBAR SPINE COMPLETE 4+V
5 series · 5 of 5 positions shown · non-contrast
Comparison: None.

CLINICAL DATA: 25-year-old with low back pain radiating into the
right lower extremity which began acutely 3 days ago. No known
injuries.

EXAM:
LUMBAR SPINE - COMPLETE 4+ VIEW

[l-spine ap]
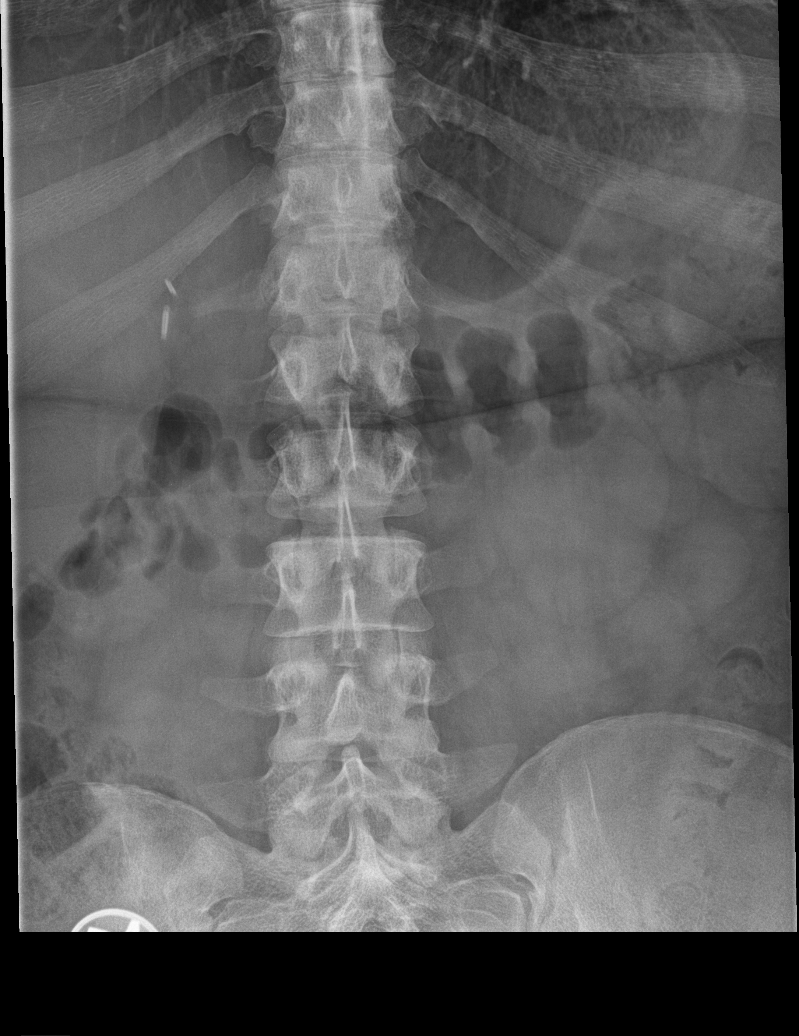

[l-spine obl (1 of 2)]
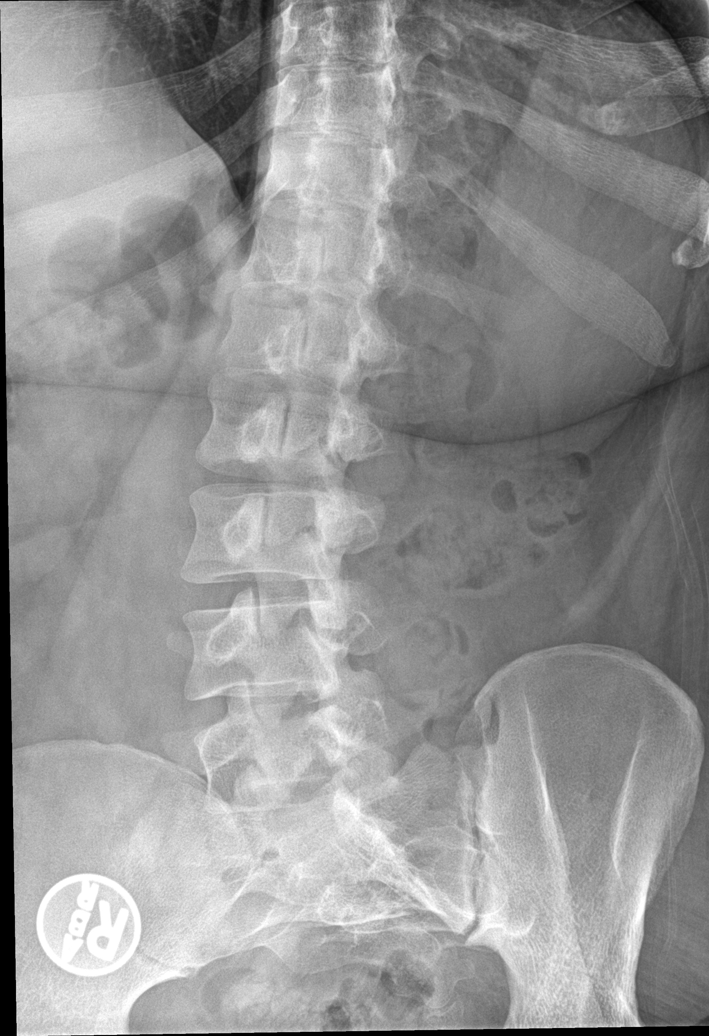

[l-spine obl (2 of 2)]
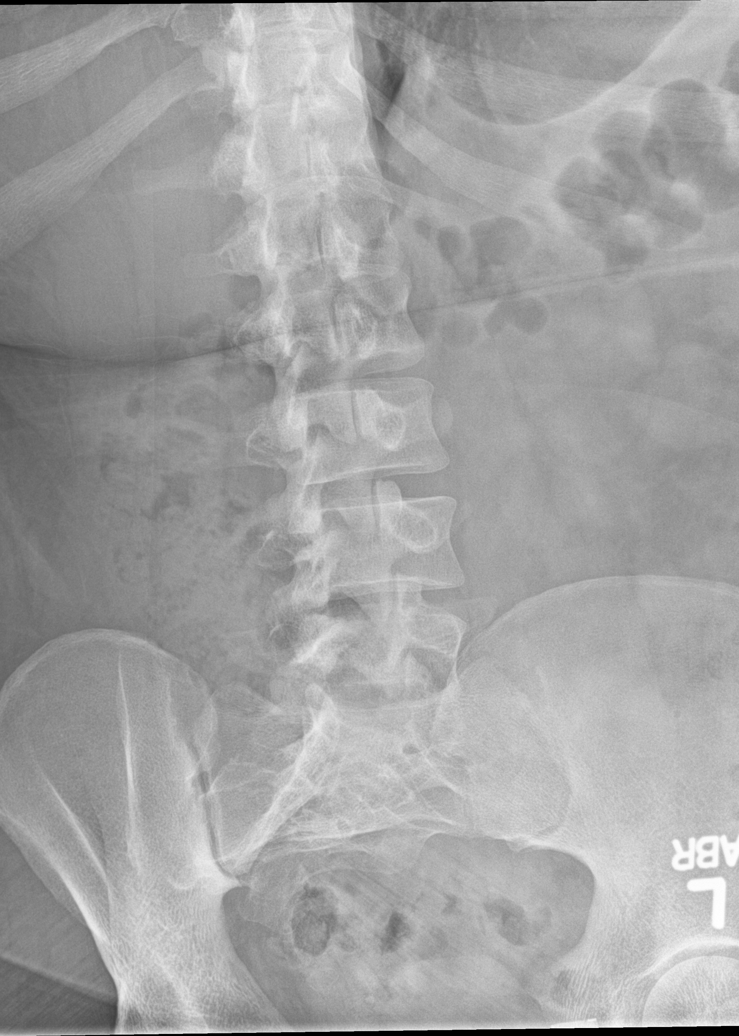

[l-spine lat]
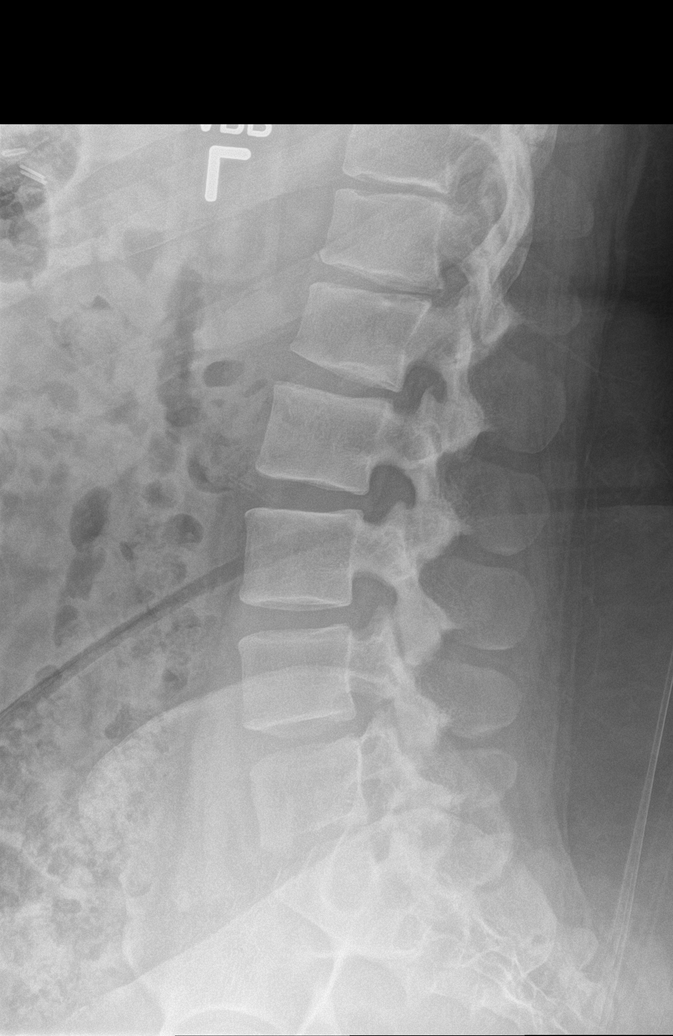

[l-spine spot]
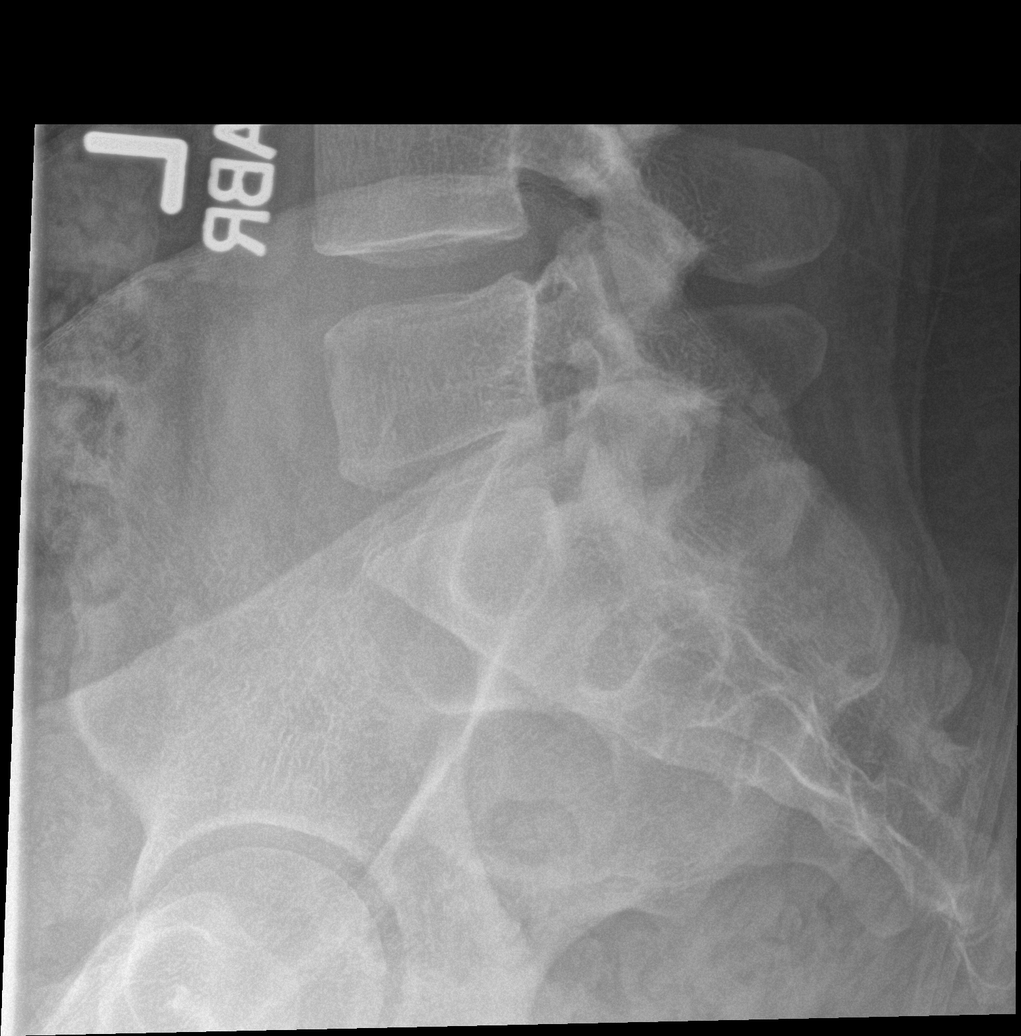

[5 of 5 positions shown; findings below may reference images not displayed]

FINDINGS: Five non-rib-bearing lumbar vertebrae with anatomic alignment. T12
has small rudimentary ribs. Ununited apophysis involving the
anterior inferior endplate of T12. Congenital mild wedge deformities
of the lower thoracic vertebrae and L1. Mild disc space narrowing at
L1-2. Remaining lumbar disc spaces well-preserved. No pars defects.
No facet arthropathy. Sacroiliac joints intact.
IMPRESSION: Mild degenerative disc disease at L1-2. No significant abnormality
otherwise.

## 2023-04-18 ENCOUNTER — Ambulatory Visit: Payer: No Typology Code available for payment source | Admitting: Nurse Practitioner

## 2023-04-19 ENCOUNTER — Encounter: Payer: Self-pay | Admitting: General Practice
